# Patient Record
Sex: Female | Born: 1970 | Race: Black or African American | Hispanic: No | Marital: Single | Smoking: Never smoker
Health system: Southern US, Community
[De-identification: ages and names within clinical notes are randomized; demographics above are authoritative.]

## PROBLEM LIST (undated history)

## (undated) DIAGNOSIS — E119 Type 2 diabetes mellitus without complications: Secondary | ICD-10-CM

## (undated) DIAGNOSIS — I10 Essential (primary) hypertension: Secondary | ICD-10-CM

## (undated) DIAGNOSIS — J45909 Unspecified asthma, uncomplicated: Secondary | ICD-10-CM

## (undated) DIAGNOSIS — G4730 Sleep apnea, unspecified: Secondary | ICD-10-CM

## (undated) DIAGNOSIS — E785 Hyperlipidemia, unspecified: Secondary | ICD-10-CM

## (undated) DIAGNOSIS — F209 Schizophrenia, unspecified: Secondary | ICD-10-CM

## (undated) DIAGNOSIS — E039 Hypothyroidism, unspecified: Secondary | ICD-10-CM

## (undated) HISTORY — PX: OTHER SURGICAL HISTORY: SHX169

## (undated) HISTORY — DX: Essential (primary) hypertension: I10

## (undated) HISTORY — DX: Type 2 diabetes mellitus without complications: E11.9

---

## 2006-02-28 DIAGNOSIS — Z8742 Personal history of other diseases of the female genital tract: Secondary | ICD-10-CM | POA: Insufficient documentation

## 2006-04-01 ENCOUNTER — Emergency Department: Payer: Self-pay | Admitting: Emergency Medicine

## 2006-04-11 ENCOUNTER — Inpatient Hospital Stay: Payer: Self-pay | Admitting: Unknown Physician Specialty

## 2008-11-05 ENCOUNTER — Ambulatory Visit: Payer: Self-pay | Admitting: Internal Medicine

## 2009-01-29 ENCOUNTER — Ambulatory Visit: Payer: Self-pay | Admitting: Internal Medicine

## 2009-09-07 ENCOUNTER — Ambulatory Visit: Payer: Self-pay | Admitting: Family Medicine

## 2013-05-17 ENCOUNTER — Ambulatory Visit: Payer: Self-pay | Admitting: Family Medicine

## 2013-10-31 ENCOUNTER — Ambulatory Visit: Payer: Self-pay | Admitting: Family Medicine

## 2014-07-23 DIAGNOSIS — Z9149 Other personal history of psychological trauma, not elsewhere classified: Secondary | ICD-10-CM | POA: Insufficient documentation

## 2014-07-23 DIAGNOSIS — F209 Schizophrenia, unspecified: Secondary | ICD-10-CM | POA: Insufficient documentation

## 2014-07-23 DIAGNOSIS — E119 Type 2 diabetes mellitus without complications: Secondary | ICD-10-CM | POA: Insufficient documentation

## 2014-07-23 DIAGNOSIS — I10 Essential (primary) hypertension: Secondary | ICD-10-CM | POA: Insufficient documentation

## 2014-07-23 DIAGNOSIS — G4730 Sleep apnea, unspecified: Secondary | ICD-10-CM | POA: Insufficient documentation

## 2014-07-23 DIAGNOSIS — G3184 Mild cognitive impairment, so stated: Secondary | ICD-10-CM | POA: Insufficient documentation

## 2014-07-23 DIAGNOSIS — J45909 Unspecified asthma, uncomplicated: Secondary | ICD-10-CM | POA: Insufficient documentation

## 2015-01-27 ENCOUNTER — Ambulatory Visit: Payer: Self-pay | Admitting: Family Medicine

## 2015-09-16 LAB — HM PAP SMEAR: HM Pap smear: NEGATIVE

## 2016-03-17 DIAGNOSIS — E669 Obesity, unspecified: Secondary | ICD-10-CM | POA: Insufficient documentation

## 2017-02-28 ENCOUNTER — Other Ambulatory Visit: Payer: Self-pay | Admitting: Family Medicine

## 2017-03-01 ENCOUNTER — Other Ambulatory Visit: Payer: Self-pay | Admitting: Family Medicine

## 2017-03-01 DIAGNOSIS — Z1239 Encounter for other screening for malignant neoplasm of breast: Secondary | ICD-10-CM

## 2017-03-29 ENCOUNTER — Ambulatory Visit
Admission: RE | Admit: 2017-03-29 | Discharge: 2017-03-29 | Disposition: A | Discharge status: Home / Self Care | Payer: Medicaid Other | Source: Ambulatory Visit | Attending: Family Medicine | Admitting: Family Medicine

## 2017-03-29 ENCOUNTER — Encounter (HOSPITAL_COMMUNITY): Payer: Self-pay

## 2017-03-29 DIAGNOSIS — Z1231 Encounter for screening mammogram for malignant neoplasm of breast: Secondary | ICD-10-CM | POA: Diagnosis not present

## 2017-03-29 DIAGNOSIS — Z1239 Encounter for other screening for malignant neoplasm of breast: Secondary | ICD-10-CM

## 2018-10-08 ENCOUNTER — Other Ambulatory Visit: Payer: Self-pay | Admitting: Family Medicine

## 2018-10-08 DIAGNOSIS — Z1231 Encounter for screening mammogram for malignant neoplasm of breast: Secondary | ICD-10-CM

## 2018-10-12 LAB — HM HIV SCREENING LAB: HM HIV Screening: NEGATIVE

## 2019-12-10 ENCOUNTER — Other Ambulatory Visit: Payer: Self-pay | Admitting: Adult Health

## 2019-12-10 DIAGNOSIS — Z1231 Encounter for screening mammogram for malignant neoplasm of breast: Secondary | ICD-10-CM

## 2020-05-04 DIAGNOSIS — E66813 Obesity, class 3: Secondary | ICD-10-CM

## 2020-05-04 DIAGNOSIS — Z6841 Body Mass Index (BMI) 40.0 and over, adult: Secondary | ICD-10-CM

## 2020-05-04 DIAGNOSIS — Z9149 Other personal history of psychological trauma, not elsewhere classified: Secondary | ICD-10-CM

## 2020-05-04 DIAGNOSIS — G3184 Mild cognitive impairment, so stated: Secondary | ICD-10-CM

## 2020-05-04 DIAGNOSIS — Z8742 Personal history of other diseases of the female genital tract: Secondary | ICD-10-CM

## 2020-05-05 ENCOUNTER — Ambulatory Visit: Payer: Self-pay

## 2020-05-05 ENCOUNTER — Other Ambulatory Visit: Payer: Self-pay

## 2020-05-05 ENCOUNTER — Ambulatory Visit (LOCAL_COMMUNITY_HEALTH_CENTER): Payer: Medicaid Other | Admitting: Family Medicine

## 2020-05-05 ENCOUNTER — Encounter: Payer: Self-pay | Admitting: Family Medicine

## 2020-05-05 VITALS — BP 104/72 | HR 66 | Ht 66.75 in | Wt 254.0 lb

## 2020-05-05 DIAGNOSIS — E119 Type 2 diabetes mellitus without complications: Secondary | ICD-10-CM

## 2020-05-05 DIAGNOSIS — Z3009 Encounter for other general counseling and advice on contraception: Secondary | ICD-10-CM

## 2020-05-05 DIAGNOSIS — Z3046 Encounter for surveillance of implantable subdermal contraceptive: Secondary | ICD-10-CM

## 2020-05-05 DIAGNOSIS — Z30017 Encounter for initial prescription of implantable subdermal contraceptive: Secondary | ICD-10-CM | POA: Diagnosis not present

## 2020-05-05 DIAGNOSIS — B3731 Acute candidiasis of vulva and vagina: Secondary | ICD-10-CM

## 2020-05-05 DIAGNOSIS — Z113 Encounter for screening for infections with a predominantly sexual mode of transmission: Secondary | ICD-10-CM

## 2020-05-05 DIAGNOSIS — L292 Pruritus vulvae: Secondary | ICD-10-CM

## 2020-05-05 DIAGNOSIS — Z6841 Body Mass Index (BMI) 40.0 and over, adult: Secondary | ICD-10-CM

## 2020-05-05 DIAGNOSIS — Z01419 Encounter for gynecological examination (general) (routine) without abnormal findings: Secondary | ICD-10-CM

## 2020-05-05 DIAGNOSIS — I10 Essential (primary) hypertension: Secondary | ICD-10-CM

## 2020-05-05 DIAGNOSIS — B373 Candidiasis of vulva and vagina: Secondary | ICD-10-CM

## 2020-05-05 LAB — WET PREP FOR TRICH, YEAST, CLUE
Trichomonas Exam: NEGATIVE
Yeast Exam: NEGATIVE

## 2020-05-05 MED ORDER — CLOTRIMAZOLE 1 % VA CREA: 1.0000 | TOPICAL_CREAM | Freq: Every day | VAGINAL | 0 refills | Status: AC

## 2020-05-05 MED ORDER — ETONOGESTREL 68 MG ~~LOC~~ IMPL
68.0000 mg | DRUG_IMPLANT | Freq: Once | SUBCUTANEOUS | Status: AC
  Administered 2020-05-05: 68 mg via SUBCUTANEOUS

## 2020-05-05 MED ORDER — NYSTATIN 100000 UNIT/GM EX POWD: 1.0000 "application " | Freq: Three times a day (TID) | CUTANEOUS | 0 refills | Status: DC

## 2020-05-05 NOTE — Progress Notes (Signed)
Family Planning Visit- Initial Visit  Subjective:  Mackenzie Randall is a 49 y.o.  No obstetric history on file.   being seen today for an initial well woman visit and to discuss family planning options.  She is currently using Nexplanon for pregnancy prevention. Patient reports she does not want a pregnancy in the next year.  Patient has the following medical conditions has Obesity, unspecified; History of abnormal cervical Pap smear; History of psychological trauma; Sleep apnea; Mild cognitive impairment; Asthma; Hypertension; Diabetes mellitus, type II ([place]); and Schizophrenia ([place]) on their problem list.  Chief Complaint  Patient presents with  . Annual Exam    Patient reports vulvar itching and irritation present for year. No aggravating or alleviating but has not tried anything for itching. Denies known history of vulvar cancer. Needs pap today. Here for birth control method as well.   Patient denies changes in medical hx though overall patient is a poor historian. Speech is delayed/slow. Has binder for care giver that needs to be filled out.    Body mass index is 40.08 kg/m. - Patient is eligible for diabetes screening based on BMI and age >60?  yes HA1C ordered? No- has PCP following   Patient reports 1 of partners in last year. Desires STI screening?  Yes  Has patient been screened once for HCV in the past?  No  No results found for: HCVAB  Does the patient have current of drug use, have a partner with drug use, and/or has been incarcerated since last result? No  If yes-- Screen for HCV through [facility]   Does the patient meet criteria for HBV testing? No  Criteria:  -Household, sexual or needle sharing contact with HBV -History of drug use -HIV positive -Those with known Hep C   Health Maintenance Due  Topic Date Due  . HEMOGLOBIN A1C  Never done  . Hepatitis C Screening  Never done  . PNEUMOCOCCAL POLYSACCHARIDE VACCINE AGE 75-64 HIGH RISK  Never done  . FOOT  EXAM  Never done  . OPHTHALMOLOGY EXAM  Never done  . COVID-19 Vaccine (1) Never done  . TETANUS/TDAP  Never done    Review of Systems  Constitutional: Negative for chills and fever.  Eyes: Negative for blurred vision and double vision.  Respiratory: Negative for cough and shortness of breath.   Cardiovascular: Negative for chest pain and orthopnea.  Gastrointestinal: Negative for nausea and vomiting.  Genitourinary: Negative for dysuria, flank pain and frequency.  Musculoskeletal: Negative for myalgias.  Skin: Negative for rash.  Neurological: Negative for dizziness, tingling, weakness and headaches.  Endo/Heme/Allergies: Does not bruise/bleed easily.  Psychiatric/Behavioral: Negative for depression and suicidal ideas. The patient is not nervous/anxious.     The following portions of the patient's history were reviewed and updated as appropriate: allergies, current medications, past family history, past medical history, past social history, past surgical history and problem list. Problem list updated.   See flowsheet for other program required questions.  Objective:   Vitals:   05/05/20 1124  BP: 104/72  Pulse: 66  Weight: 254 lb (115.2 kg)  Height: 5' 6.75" (1.695 m)    Physical Exam Vitals and nursing note reviewed. Exam conducted with a chaperone present.  Constitutional:      Appearance: Normal appearance.  HENT:     Head: Normocephalic.  Pulmonary:     Effort: Pulmonary effort is normal.  Genitourinary:    Exam position: Lithotomy position.     Tanner stage (  genital): 5.     Labia:        Right: Injury present.        Left: Injury present.      Vagina: Normal.     Cervix: Normal.     Uterus: Normal.      Adnexa: Right adnexa normal.     Comments: Bilateral labia are devoid of public hair and have evidence of excoriation. + hypopigmentation and evidence of new skin growth. Labia are speckled in appearance with pink healing skin patches.  Musculoskeletal:         General: Normal range of motion.  Skin:    General: Skin is warm and dry.  Neurological:     Mental Status: She is alert. Mental status is at baseline.     Nexplanon Removal and Insertion  Patient identified, informed consent performed, consent signed.   Patient does understand that irregular bleeding is a very common side effect of this medication. She was advised to have backup contraception for one week after replacement of the implant. Patient deemed to meet WHO criteria for being reasonably certain she is not pregnant.  Appropriate time out taken. Nexplanon site identified. Area prepped in usual sterile fashon. 3 ml of 1% lidocaine with epinephrine was used to anesthetize the area at the distal end of the implant. A small stab incision was made right beside the implant on the distal portion. The Nexplanon rod was grasped using hemostats and removed without difficulty. There was minimal blood loss. There were no complications.   Confirmed correct location of insertion site. The insertion site was identified 8-10 cm (3-4 inches) from the medial epicondyle of the humerus and 3-5 cm (1.25-2 inches) posterior to (below) the sulcus (groove) between the biceps and triceps muscles of the patient's left arm (same location at removed Nexplanon). New Nexplanon removed from packaging, Device confirmed in needle, then inserted full length of needle and withdrawn per handbook instructions. Nexplanon was able to palpated in the patient's leftarm; patient palpated the insert herself.  There was minimal blood loss. Patient insertion site covered with guaze and a pressure bandage to reduce any bruising. The patient tolerated the procedure well and was given post procedure instructions.     Assessment and Plan:  Mackenzie Randall is a 49 y.o. female presenting to the [facility] Department for an initial well woman exam/family planning visit  Contraception counseling: Reviewed all forms of birth  control options in the tiered based approach. available including abstinence; over the counter/barrier methods; hormonal contraceptive medication including pill, patch, ring, injection,contraceptive implant, ECP; hormonal and nonhormonal IUDs; permanent sterilization options including vasectomy and the various tubal sterilization modalities. Risks, benefits, and typical effectiveness rates were reviewed.  Questions were answered.  Written information was also given to the patient to review.  Patient desires nexplanon, this was placed for patient. She will follow up in  23yr for surveillance.  She was told to call with any further questions, or with any concerns about this method of contraception.  Emphasized use of condoms 100% of the time for STI prevention.  1. Class 3 severe obesity with body mass index (BMI) of 40.0 to 44.9 in adult, unspecified obesity type, unspecified whether serious comorbidity present (HCC) PCP to follow  2. Essential hypertension PCP to Follow  3. Type 2 diabetes mellitus without complication, unspecified whether long term insulin use (HCC) PCP to follow  4. Well woman exam with routine gynecological exam Reviewed diet and exercise Discussed preventive health  measures Recommended PCP care  Discussed colon cancer screening through PCP starting at 50 Updated the provided care giver binder with plan of care Pap today done Referred for mammogram today - Chlamydia/Gonorrhea [facility] - HIV/HCV [facility] - Syphilis Serology, [facility] - IGP, Aptima HPV  5. Nexplanon removal Performed today  6. Screening examination for venereal disease Treat wet mount per standing-- have + discharge with itching - WET PREP FOR TRICH, YEAST, CLUE - Chlamydia/Gonorrhea [facility] - HIV/HCV [facility] - Syphilis Serology, [facility]  7. Labial abnormality/Vulvar  - Could be vulvar excoriation for vulvar candidiasis but given severe presentation will treat empirically  with vaginal cream from [facility] supply and rx for  Topical powder for external use was sent to pharmacy.  - Referral placed to local OB GYN group, possible vulvar bx needed to rule out other causes of severe pruritis.  - Recommended patient follow up with PCP as well for T2DM management  - Updated provided caregiver binder with plan of care   Return in about 1 year (around 05/05/2021) for Yearly wellness exam.  No future appointments.  Federico Flake, MD

## 2020-05-05 NOTE — Progress Notes (Signed)
Copy of pharmacist drug review form sent for scanning. Client lives at [facility] and group home employee is not with client to verify all medications. Client reports am fingerstick glucose today was 113. Treated for yeast with Clotrimazole 1% vaginal cream as per standing order per verbal order from Dr. Alvester Morin. Client able to verbalize to RN how to use cream. Jossie Ng, RN

## 2020-05-08 LAB — IGP, APTIMA HPV
HPV Aptima: NEGATIVE
PAP Smear Comment: 0

## 2020-05-12 ENCOUNTER — Telehealth: Payer: Self-pay | Admitting: Obstetrics & Gynecology

## 2020-05-12 ENCOUNTER — Encounter: Payer: Self-pay | Admitting: Family Medicine

## 2020-05-12 LAB — HM HEPATITIS C SCREENING LAB: HM Hepatitis Screen: NEGATIVE

## 2020-05-12 LAB — HM HIV SCREENING LAB: HM HIV Screening: NEGATIVE

## 2020-05-12 NOTE — Telephone Encounter (Signed)
[facility] referring for Vulvar candidiasis ,Vulvar pruritus

## 2020-06-02 ENCOUNTER — Encounter: Payer: Self-pay | Admitting: Obstetrics & Gynecology

## 2020-07-06 ENCOUNTER — Encounter: Payer: Medicaid Other | Admitting: Obstetrics and Gynecology

## 2020-07-27 ENCOUNTER — Encounter: Payer: Medicaid Other | Admitting: Obstetrics and Gynecology

## 2020-08-12 ENCOUNTER — Encounter: Payer: Self-pay | Admitting: Obstetrics and Gynecology

## 2020-08-12 ENCOUNTER — Other Ambulatory Visit: Payer: Self-pay

## 2020-08-12 ENCOUNTER — Ambulatory Visit (INDEPENDENT_AMBULATORY_CARE_PROVIDER_SITE_OTHER): Payer: Medicaid Other | Admitting: Obstetrics and Gynecology

## 2020-08-12 ENCOUNTER — Telehealth: Payer: Self-pay | Admitting: Obstetrics and Gynecology

## 2020-08-12 VITALS — BP 128/70 | HR 70 | Resp 18 | Wt 258.2 lb

## 2020-08-12 DIAGNOSIS — B373 Candidiasis of vulva and vagina: Secondary | ICD-10-CM | POA: Diagnosis not present

## 2020-08-12 DIAGNOSIS — L280 Lichen simplex chronicus: Secondary | ICD-10-CM

## 2020-08-12 DIAGNOSIS — N852 Hypertrophy of uterus: Secondary | ICD-10-CM | POA: Diagnosis not present

## 2020-08-12 DIAGNOSIS — B3731 Acute candidiasis of vulva and vagina: Secondary | ICD-10-CM

## 2020-08-12 MED ORDER — CLOBETASOL PROPIONATE 0.05 % EX OINT: TOPICAL_OINTMENT | CUTANEOUS | 5 refills | Status: DC

## 2020-08-12 MED ORDER — FLUCONAZOLE 150 MG PO TABS: 150.0000 mg | ORAL_TABLET | ORAL | 5 refills | Status: AC

## 2020-08-12 NOTE — Telephone Encounter (Signed)
Mackenzie Randall with [facility] calling to confirm Rx for Diflucan.  Confirming whether refills x5 are as needed or if patient is to continue taking.

## 2020-08-12 NOTE — Patient Instructions (Signed)
Vaginal Yeast Infection, Adult  Vaginal yeast infection is a condition that causes vaginal discharge as well as soreness, swelling, and redness (inflammation) of the vagina. This is a common condition. Some women get this infection frequently. What are the causes? This condition is caused by a change in the normal balance of the yeast (candida) and bacteria that live in the vagina. This change causes an overgrowth of yeast, which causes the inflammation. What increases the risk? The condition is more likely to develop in women who:  Take antibiotic medicines.  Have diabetes.  Take birth control pills.  Are pregnant.  Douche often.  Have a weak body defense system (immune system).  Have been taking steroid medicines for a long time.  Frequently wear tight clothing. What are the signs or symptoms? Symptoms of this condition include:  White, thick, creamy vaginal discharge.  Swelling, itching, redness, and irritation of the vagina. The lips of the vagina (vulva) may be affected as well.  Pain or a burning feeling while urinating.  Pain during sex. How is this diagnosed? This condition is diagnosed based on:  Your medical history.  A physical exam.  A pelvic exam. Your health care provider will examine a sample of your vaginal discharge under a microscope. Your health care provider may send this sample for testing to confirm the diagnosis. How is this treated? This condition is treated with medicine. Medicines may be over-the-counter or prescription. You may be told to use one or more of the following:  Medicine that is taken by mouth (orally).  Medicine that is applied as a cream (topically).  Medicine that is inserted directly into the vagina (suppository). Follow these instructions at home:  Lifestyle  Do not have sex until your health care provider approves. Tell your sex partner that you have a yeast infection. That person should go to his or her health care  provider and ask if they should also be treated.  Do not wear tight clothes, such as pantyhose or tight pants.  Wear breathable cotton underwear. General instructions  Take or apply over-the-counter and prescription medicines only as told by your health care provider.  Eat more yogurt. This may help to keep your yeast infection from returning.  Do not use tampons until your health care provider approves.  Try taking a sitz bath to help with discomfort. This is a warm water bath that is taken while you are sitting down. The water should only come up to your hips and should cover your buttocks. Do this 3-4 times per day or as told by your health care provider.  Do not douche.  If you have diabetes, keep your blood sugar levels under control.  Keep all follow-up visits as told by your health care provider. This is important. Contact a health care provider if:  You have a fever.  Your symptoms go away and then return.  Your symptoms do not get better with treatment.  Your symptoms get worse.  You have new symptoms.  You develop blisters in or around your vagina.  You have blood coming from your vagina and it is not your menstrual period.  You develop pain in your abdomen. Summary  Vaginal yeast infection is a condition that causes discharge as well as soreness, swelling, and redness (inflammation) of the vagina.  This condition is treated with medicine. Medicines may be over-the-counter or prescription.  Take or apply over-the-counter and prescription medicines only as told by your health care provider.  Do not douche.   Do not have sex or use tampons until your health care provider approves.  Contact a health care provider if your symptoms do not get better with treatment or your symptoms go away and then return. This information is not intended to replace advice given to you by your health care provider. Make sure you discuss any questions you have with your health care  provider. Document Revised: 06/14/2019 Document Reviewed: 04/02/2018 Elsevier Patient Education  2020 Elsevier Inc.  

## 2020-08-12 NOTE — Progress Notes (Signed)
Patient ID: Mackenzie Randall, female   DOB: 08-19-1971, 49 y.o.   MRN: [id number]  Reason for Consult: Gynecologic Exam   Referred by Federico Flake,*  Subjective:     HPI:  Mackenzie Randall is a 49 y.o. female. She presents today without a care giver. Information and history is limited. She has seen Dr. Alvester Morin for vulvar candidiasis in the past which was reported in June of 2021 to be ongoing for a year. She is a resident in a group home. She does have diabetes. I do not know what type of treatment she has had for this issue. She reports she does have vaginal itching and she consented to an examination.   She denies heavy vaginal bleeding. She has a Nexplanon in place, replaced June 2021.  Pap smear in 2021 was NIL    Past Medical History:  Diagnosis Date  . Diabetes mellitus without complication (HCC)   . Hypertension    Family History  Problem Relation Age of Onset  . Diabetes Mother   . Hypertension Mother   . Sleep apnea Father   . Heart attack Father   . Gout Maternal Grandfather    Past Surgical History:  Procedure Laterality Date  . Denies surgical history      Short Social History:  Social History   Tobacco Use  . Smoking status: Former Games developer  . Smokeless tobacco: Never Used  . Tobacco comment: Reports remote history of cigarette use. Unsure what year quit as has been so long ago.  Substance Use Topics  . Alcohol use: Not Currently    No Known Allergies  Current Outpatient Medications  Medication Sig Dispense Refill  . amLODipine (NORVASC) 10 MG tablet Take 10 mg by mouth daily.    . carvedilol (COREG) 25 MG tablet Take 25 mg by mouth daily.    . cloNIDine (CATAPRES) 0.1 MG tablet Take 0.1 mg by mouth daily.    . furosemide (LASIX) 20 MG tablet Take 20 mg by mouth daily.    Marland Kitchen glipiZIDE (GLUCOTROL XL) 10 MG 24 hr tablet Take 10 mg by mouth daily.    . hydrochlorothiazide (HYDRODIURIL) 25 MG tablet Take 25 mg by mouth daily.    . insulin glargine  (LANTUS) 100 UNIT/ML injection Inject 50 Units into the skin at bedtime.    Marland Kitchen levothyroxine (SYNTHROID) 75 MCG tablet Take 75 mcg by mouth daily.    Marland Kitchen lisinopril (ZESTRIL) 40 MG tablet Take 40 mg by mouth daily.    . metFORMIN (GLUCOPHAGE) 1000 MG tablet Take 1,000 mg by mouth in the morning and at bedtime.    Marland Kitchen nystatin (MYCOSTATIN/NYSTOP) powder Apply 1 application topically 3 (three) times daily. 15 g 0  . pravastatin (PRAVACHOL) 40 MG tablet Take 40 mg by mouth daily.    . QUEtiapine (SEROQUEL XR) 400 MG 24 hr tablet Take 400 mg by mouth daily.    . sitaGLIPtin (JANUVIA) 100 MG tablet Take 100 mg by mouth daily.     No current facility-administered medications for this visit.    Review of Systems  Constitutional: Negative for chills, fatigue, fever and unexpected weight change.  HENT: Negative for trouble swallowing.  Eyes: Negative for loss of vision.  Respiratory: Negative for cough, shortness of breath and wheezing.  Cardiovascular: Negative for chest pain, leg swelling, palpitations and syncope.  GI: Negative for abdominal pain, blood in stool, diarrhea, nausea and vomiting.  GU: Negative for difficulty urinating, dysuria, frequency and hematuria.  Musculoskeletal:  Negative for back pain, leg pain and joint pain.  Skin: Negative for rash.  Neurological: Negative for dizziness, headaches, light-headedness, numbness and seizures.  Psychiatric: Negative for behavioral problem, confusion, depressed mood and sleep disturbance.        Objective:  Objective   Vitals:   08/12/20 1052  BP: 128/70  Pulse: 70  Resp: 18  SpO2: 98%  Weight: 258 lb 3.2 oz (117.1 kg)   Body mass index is 40.74 kg/m.  Physical Exam Vitals and nursing note reviewed.  Constitutional:      Appearance: She is well-developed.  HENT:     Head: Normocephalic and atraumatic.  Eyes:     Pupils: Pupils are equal, round, and reactive to light.  Cardiovascular:     Rate and Rhythm: Normal rate and  regular rhythm.  Pulmonary:     Effort: Pulmonary effort is normal. No respiratory distress.  Skin:    General: Skin is warm and dry.  Neurological:     Mental Status: She is alert and oriented to person, place, and time.  Psychiatric:        Behavior: Behavior normal.        Thought Content: Thought content normal.        Judgment: Judgment normal.        Assessment/Plan:    49 yo with vulvar puritis- appearance consistent with lichen simplex chronicus.  Start daily clobetasol ointment applied 1-2 times a day to the affected area for the next 4 weeks. Will need to continue treatment likely for several months until resolution.   Possible recurrent yeast vaginitis, curd like discharge in groin- will check Nuswab. Will start Oral diflucan weekly for yeast suppression. Yeast can sometimes be an inciting event for  lichen chronicus.   Bulky uterus, return for a pelvic US. Likely fibroid. Patient does not report heavy bleeding at this time. Nexplanon in place.   More than 25 minutes were spent face to face with the patient in the room, reviewing the medical record, labs and images, and coordinating care for the patient. The plan of management was discussed in detail and counseling was provided.      Adelene Idler MD [facility], [facility] 08/12/2020 11:38 AM

## 2020-08-15 LAB — NUSWAB BV AND CANDIDA, NAA
Candida albicans, NAA: NEGATIVE
Candida glabrata, NAA: NEGATIVE

## 2020-09-08 ENCOUNTER — Encounter: Payer: Self-pay | Admitting: Obstetrics and Gynecology

## 2020-09-08 ENCOUNTER — Ambulatory Visit (INDEPENDENT_AMBULATORY_CARE_PROVIDER_SITE_OTHER): Payer: Medicaid Other

## 2020-09-08 ENCOUNTER — Ambulatory Visit (INDEPENDENT_AMBULATORY_CARE_PROVIDER_SITE_OTHER): Payer: Medicaid Other | Admitting: Obstetrics and Gynecology

## 2020-09-08 ENCOUNTER — Other Ambulatory Visit: Payer: Self-pay

## 2020-09-08 VITALS — BP 118/72 | HR 89 | Resp 18 | Wt 256.0 lb

## 2020-09-08 DIAGNOSIS — Z0000 Encounter for general adult medical examination without abnormal findings: Secondary | ICD-10-CM | POA: Diagnosis not present

## 2020-09-08 DIAGNOSIS — N852 Hypertrophy of uterus: Secondary | ICD-10-CM

## 2020-09-08 DIAGNOSIS — L280 Lichen simplex chronicus: Secondary | ICD-10-CM

## 2020-09-08 DIAGNOSIS — Z23 Encounter for immunization: Secondary | ICD-10-CM | POA: Diagnosis not present

## 2020-09-08 MED ORDER — CLOBETASOL PROPIONATE 0.05 % EX OINT: TOPICAL_OINTMENT | CUTANEOUS | 5 refills | Status: AC

## 2020-09-08 NOTE — Progress Notes (Signed)
Patient ID: Mackenzie Randall, female   DOB: [birth year], 49 y.o.   MRN: [id number]  Reason for Consult: Gynecologic Exam   Referred by [facility]*  Subjective:     HPI:  Mackenzie Randall is a 49 y.o. female . She reports that she is feeling much better. Her urge to itch has been greatly reduced. She is applying clobetasol twice a day.   Past Medical History:  Diagnosis Date  . Diabetes mellitus without complication (HCC)   . Hypertension    Family History  Problem Relation Age of Onset  . Diabetes Mother   . Hypertension Mother   . Sleep apnea Father   . Heart attack Father   . Gout Maternal Grandfather    Past Surgical History:  Procedure Laterality Date  . Denies surgical history      Short Social History:  Social History   Tobacco Use  . Smoking status: Former Games developer  . Smokeless tobacco: Never Used  . Tobacco comment: Reports remote history of cigarette use. Unsure what year quit as has been so long ago.  Substance Use Topics  . Alcohol use: Not Currently    No Known Allergies  Current Outpatient Medications  Medication Sig Dispense Refill  . amLODipine (NORVASC) 10 MG tablet Take 10 mg by mouth daily.    . carvedilol (COREG) 25 MG tablet Take 25 mg by mouth daily.    . clobetasol ointment (TEMOVATE) 0.05 % Apply once a day for 4 weeks, then every other day for 4 weeks, then once a week as needed. 30 g 5  . cloNIDine (CATAPRES) 0.1 MG tablet Take 0.1 mg by mouth daily.    . furosemide (LASIX) 20 MG tablet Take 20 mg by mouth daily.    Marland Kitchen glipiZIDE (GLUCOTROL XL) 10 MG 24 hr tablet Take 10 mg by mouth daily.    . hydrochlorothiazide (HYDRODIURIL) 25 MG tablet Take 25 mg by mouth daily.    . insulin glargine (LANTUS) 100 UNIT/ML injection Inject 50 Units into the skin at bedtime.    Marland Kitchen levothyroxine (SYNTHROID) 75 MCG tablet Take 75 mcg by mouth daily.    Marland Kitchen lisinopril (ZESTRIL) 40 MG tablet Take 40 mg by mouth daily.    . metFORMIN (GLUCOPHAGE) 1000 MG  tablet Take 1,000 mg by mouth in the morning and at bedtime.    Marland Kitchen nystatin (MYCOSTATIN/NYSTOP) powder Apply 1 application topically 3 (three) times daily. 15 g 0  . pravastatin (PRAVACHOL) 40 MG tablet Take 40 mg by mouth daily.    . QUEtiapine (SEROQUEL XR) 400 MG 24 hr tablet Take 400 mg by mouth daily.    . sitaGLIPtin (JANUVIA) 100 MG tablet Take 100 mg by mouth daily.     No current facility-administered medications for this visit.    Review of Systems  Constitutional: Negative for chills, fatigue, fever and unexpected weight change.  HENT: Negative for trouble swallowing.  Eyes: Negative for loss of vision.  Respiratory: Negative for cough, shortness of breath and wheezing.  Cardiovascular: Negative for chest pain, leg swelling, palpitations and syncope.  GI: Negative for abdominal pain, blood in stool, diarrhea, nausea and vomiting.  GU: Negative for difficulty urinating, dysuria, frequency and hematuria.  Musculoskeletal: Negative for back pain, leg pain and joint pain.  Skin: Negative for rash.  Neurological: Negative for dizziness, headaches, light-headedness, numbness and seizures.  Psychiatric: Negative for behavioral problem, confusion, depressed mood and sleep disturbance.        Objective:  Objective   Vitals:   09/08/20 1102  BP: 118/72  Pulse: 89  Resp: 18  SpO2: 98%  Weight: 256 lb (116.1 kg)   Body mass index is 40.4 kg/m.  Physical Exam Exam conducted with a chaperone present.  Genitourinary:      Comments: Significant improvement from last exam. Area is reduced in redness and swelling. Skin is almost completely healed. Small pink skin still present on right labia, but largely healed.         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 Patient reports she is not itching as much and that see feels significantly better. Will continue clobetasol ointment. Decrease to once a day for 4 weeks. Then apply every other  night for 4 weeks. Then use once a week as needed.  Call if any worsening of symptoms.  Was on 4 weeks of diflucan for topical yeast infection, could consider  Extended treatment for 6 months if needed.  Normal pelvic US today.   More than 20 minutes were spent face to face with the patient in the room, reviewing the medical record, labs and images, and coordinating care for the patient. The plan of management was discussed in detail and counseling was provided.    Adelene Idler MD, [facility], [facility] 09/08/2020 12:00 PM

## 2020-09-08 NOTE — Progress Notes (Signed)
Pt here for GYN and U/S.

## 2020-11-12 ENCOUNTER — Other Ambulatory Visit: Payer: Self-pay

## 2020-11-12 ENCOUNTER — Ambulatory Visit
Admission: RE | Admit: 2020-11-12 | Discharge: 2020-11-12 | Disposition: A | Discharge status: Home / Self Care | Payer: Medicaid Other | Source: Ambulatory Visit | Attending: Family Medicine | Admitting: Family Medicine

## 2020-11-12 DIAGNOSIS — Z1231 Encounter for screening mammogram for malignant neoplasm of breast: Secondary | ICD-10-CM | POA: Diagnosis not present

## 2020-11-12 DIAGNOSIS — Z01419 Encounter for gynecological examination (general) (routine) without abnormal findings: Secondary | ICD-10-CM

## 2020-11-25 ENCOUNTER — Telehealth: Payer: Self-pay

## 2020-11-25 NOTE — Telephone Encounter (Signed)
Return call by patient.  Reviewed Mammogram results with patient.  Low risk Mammogram.  Repeat 1-2 years per Lyndel Safe, MD. Hart Carwin, RN

## 2020-11-25 NOTE — Telephone Encounter (Signed)
Telephone call to patient today.  Left a message to return my call. Hart Carwin, RN    RE: Mammogram is low risk.  Repeat 1-2 years per Lyndel Safe, MD. Hart Carwin, RN

## 2020-12-09 ENCOUNTER — Other Ambulatory Visit: Payer: Self-pay

## 2020-12-09 ENCOUNTER — Ambulatory Visit (INDEPENDENT_AMBULATORY_CARE_PROVIDER_SITE_OTHER): Payer: Medicaid Other | Admitting: Obstetrics and Gynecology

## 2020-12-09 ENCOUNTER — Encounter: Payer: Self-pay | Admitting: Obstetrics and Gynecology

## 2020-12-09 NOTE — Progress Notes (Signed)
PHONE VISIT Pt lives in a group home i'm not sure she knew why I was calling. Pt hung the phone up.

## 2021-03-24 ENCOUNTER — Other Ambulatory Visit: Payer: Self-pay | Admitting: Adult Health

## 2021-03-24 DIAGNOSIS — Z1231 Encounter for screening mammogram for malignant neoplasm of breast: Secondary | ICD-10-CM

## 2021-03-24 NOTE — Progress Notes (Signed)
Appointment cancelled, not able to reach patient.

## 2021-12-20 ENCOUNTER — Other Ambulatory Visit: Payer: Self-pay | Admitting: Family Medicine

## 2022-01-03 ENCOUNTER — Other Ambulatory Visit: Payer: Self-pay

## 2022-01-03 ENCOUNTER — Ambulatory Visit
Admission: RE | Admit: 2022-01-03 | Discharge: 2022-01-03 | Disposition: A | Discharge status: Home / Self Care | Payer: Medicaid Other | Source: Ambulatory Visit | Attending: Adult Health | Admitting: Adult Health

## 2022-01-03 DIAGNOSIS — Z1231 Encounter for screening mammogram for malignant neoplasm of breast: Secondary | ICD-10-CM | POA: Diagnosis present

## 2022-03-05 IMAGING — MG MM DIGITAL SCREENING BILAT W/ TOMO AND CAD
8 series · 8 of 24 positions shown · non-contrast
Comparison: Previous exam(s).

ACR Breast Density Category a: The breast tissue is almost entirely
fatty.

CLINICAL DATA: Screening.

EXAM:
DIGITAL SCREENING BILATERAL MAMMOGRAM WITH TOMOSYNTHESIS AND CAD
TECHNIQUE: Bilateral screening digital craniocaudal and mediolateral oblique
mammograms were obtained. Bilateral screening digital breast
tomosynthesis was performed. The images were evaluated with
computer-aided detection.

[R MLO synth-2D]
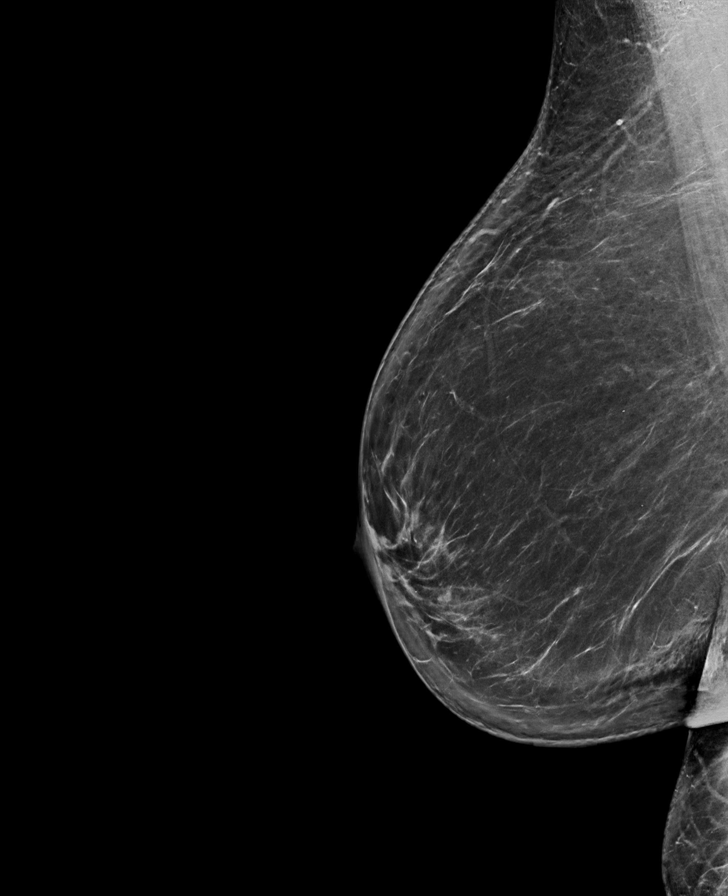

[L MLO synth-2D]
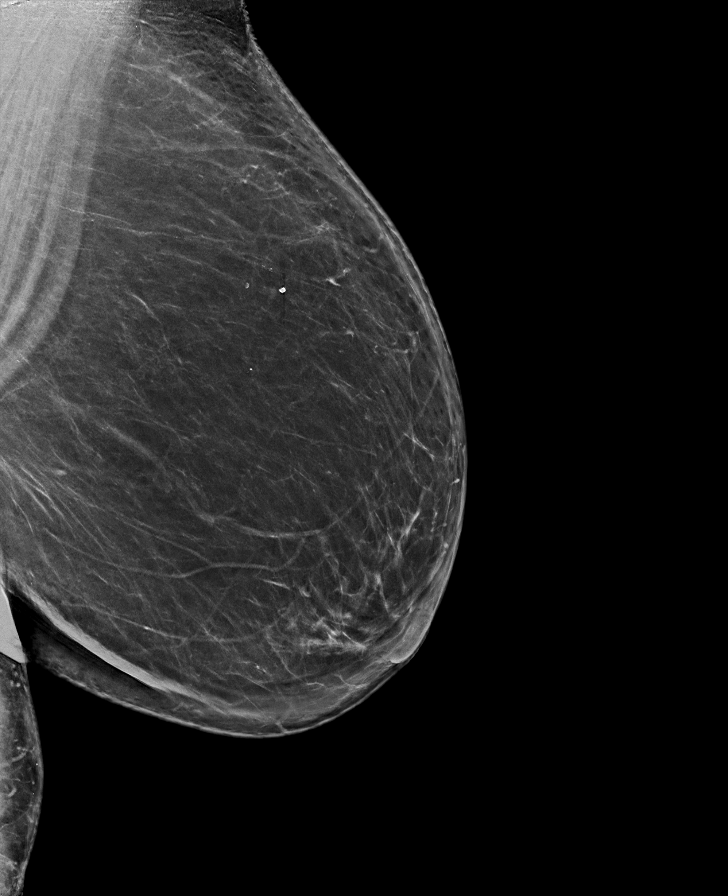

[R CC synth-2D]
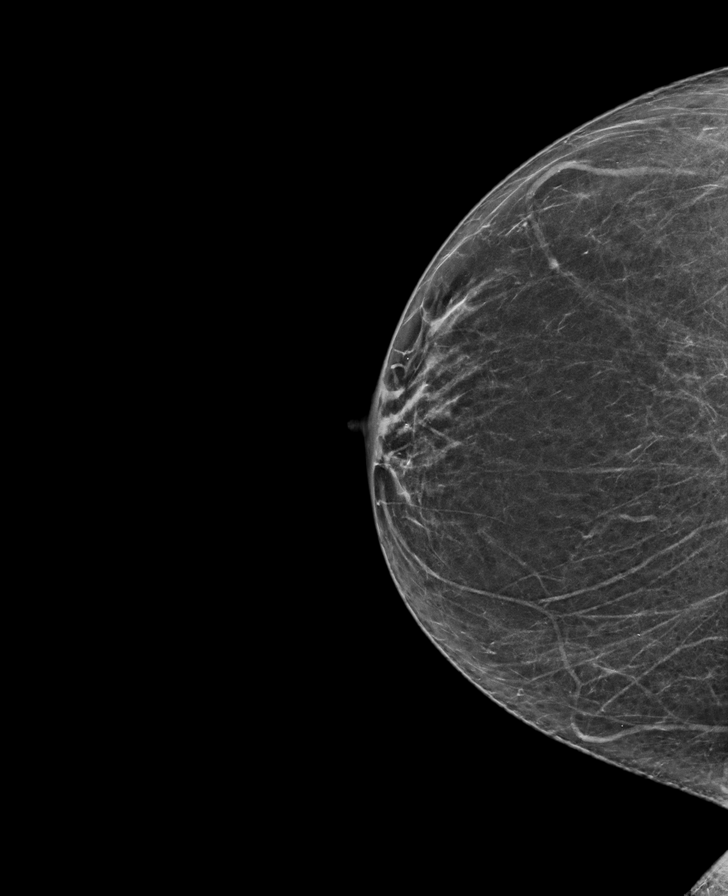

[L CC synth-2D]
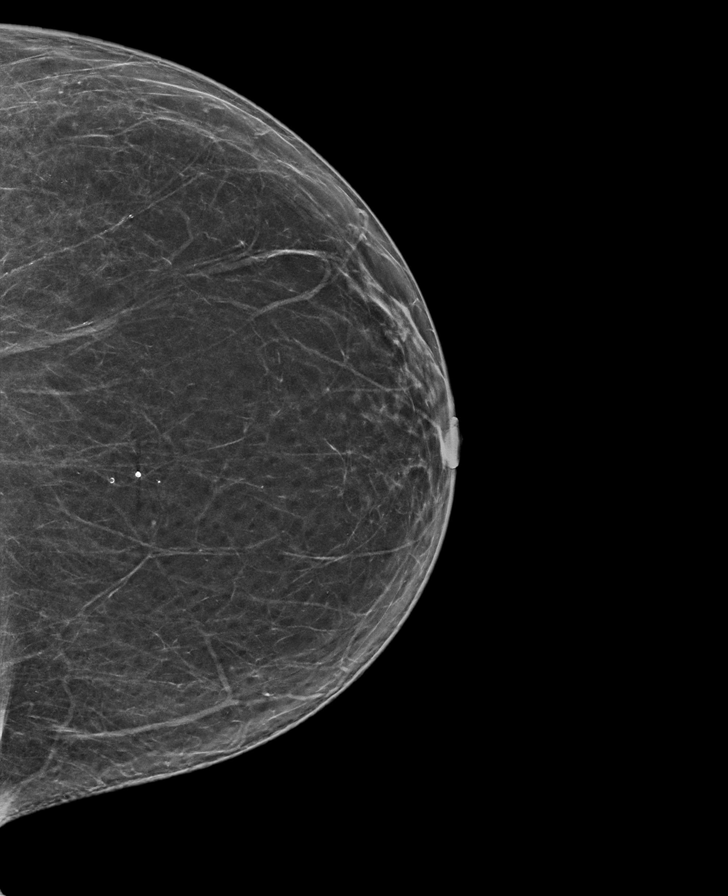

[R CC tomo · tomo slice 37/74.0]
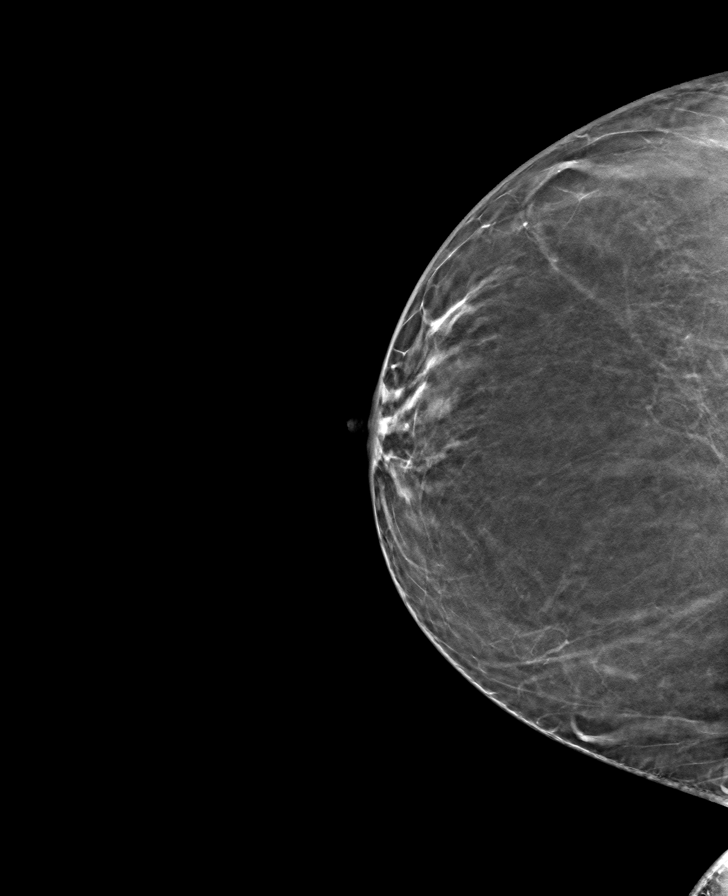

[L CC tomo · tomo slice 37/74.0]
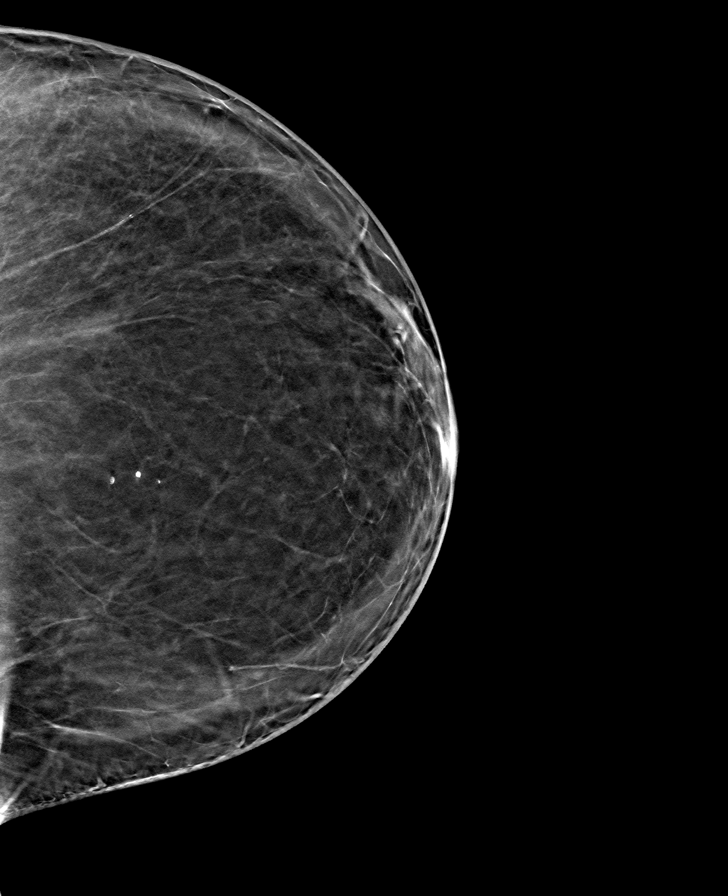

[L MLO tomo · tomo slice 48/95.0]
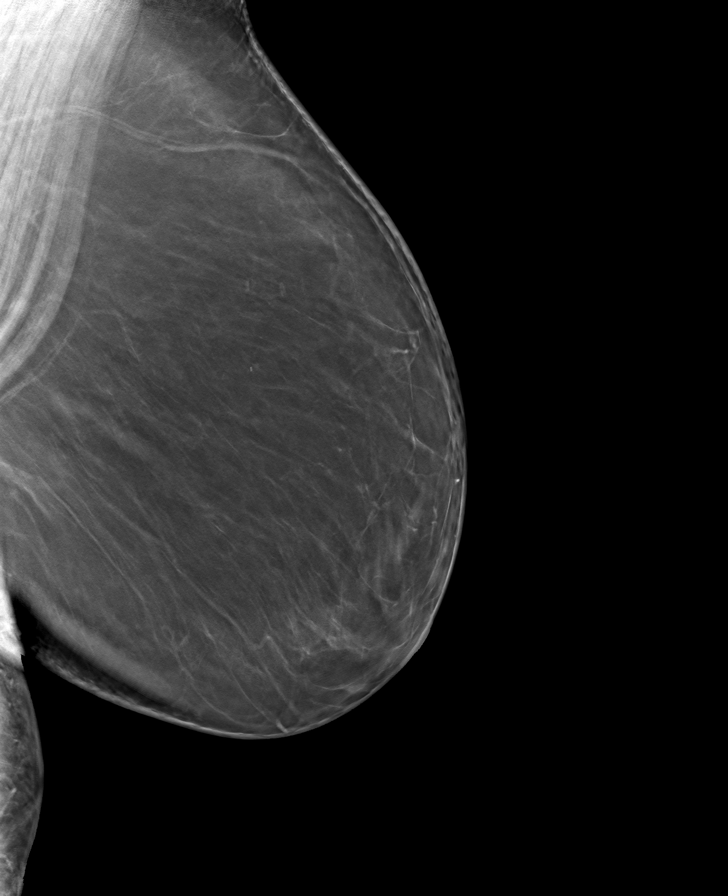

[R MLO tomo · tomo slice 45/90.0]
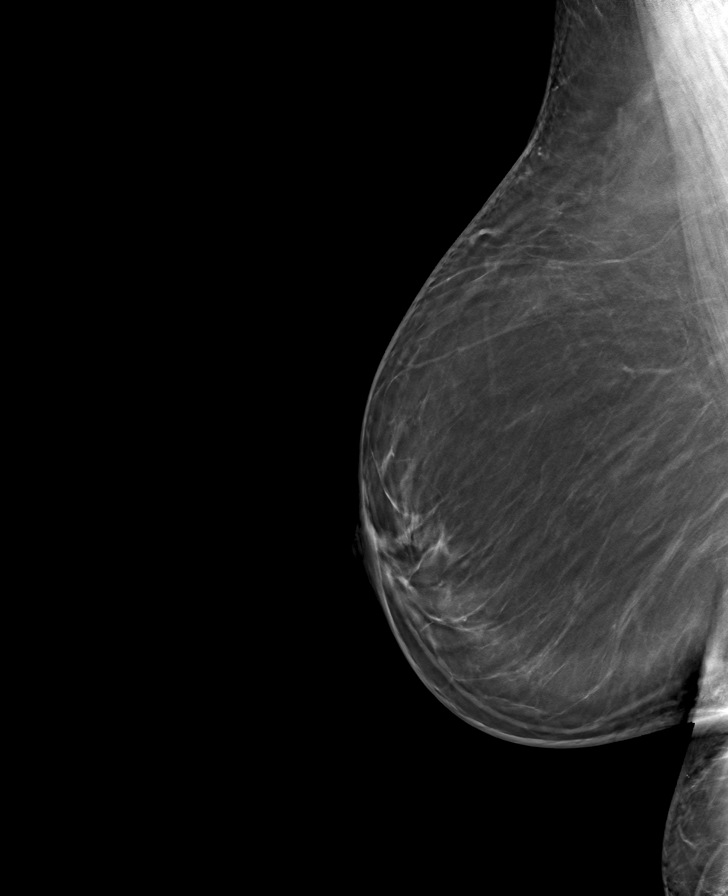

[8 of 24 positions shown; findings below may reference images not displayed]

FINDINGS: There are no findings suspicious for malignancy.
IMPRESSION: No mammographic evidence of malignancy. A result letter of this
screening mammogram will be mailed directly to the patient.

RECOMMENDATION:
Screening mammogram in one year. (Code:0E-3-N98)

BI-RADS CATEGORY  1: Negative.

## 2022-04-21 ENCOUNTER — Other Ambulatory Visit: Payer: Self-pay

## 2023-02-01 ENCOUNTER — Ambulatory Visit (LOCAL_COMMUNITY_HEALTH_CENTER): Payer: Medicaid Other | Admitting: Family Medicine

## 2023-02-01 ENCOUNTER — Encounter: Payer: Medicaid Other | Admitting: Family Medicine

## 2023-02-01 ENCOUNTER — Encounter: Payer: Self-pay | Admitting: Family Medicine

## 2023-02-01 ENCOUNTER — Ambulatory Visit: Payer: Medicaid Other

## 2023-02-01 VITALS — BP 129/74 | HR 72 | Temp 96.8°F | Wt 224.6 lb

## 2023-02-01 DIAGNOSIS — Z23 Encounter for immunization: Secondary | ICD-10-CM | POA: Diagnosis not present

## 2023-02-01 DIAGNOSIS — Z975 Presence of (intrauterine) contraceptive device: Secondary | ICD-10-CM

## 2023-02-01 DIAGNOSIS — Z3009 Encounter for other general counseling and advice on contraception: Secondary | ICD-10-CM

## 2023-02-01 DIAGNOSIS — Z113 Encounter for screening for infections with a predominantly sexual mode of transmission: Secondary | ICD-10-CM

## 2023-02-01 DIAGNOSIS — Z01419 Encounter for gynecological examination (general) (routine) without abnormal findings: Secondary | ICD-10-CM

## 2023-02-01 DIAGNOSIS — Z8742 Personal history of other diseases of the female genital tract: Secondary | ICD-10-CM

## 2023-02-01 LAB — WET PREP FOR TRICH, YEAST, CLUE
Trichomonas Exam: NEGATIVE
Yeast Exam: NEGATIVE

## 2023-02-01 LAB — HM HIV SCREENING LAB: HM HIV Screening: NEGATIVE

## 2023-02-01 NOTE — Progress Notes (Addendum)
Pt here for [facility], PE and STI routine screen. Immunization visit made for pt & pt given flu shot this visit, tolerated well, VIS along with copy of immunization record given. Pt needs paperwork to be read to as pt states that she cannot read.  [facility] referral faxed with confirmation received.  Wet prep reviewed; no treatment indicated.   Harland Dingwall, RN

## 2023-02-01 NOTE — Progress Notes (Signed)
Please refer to 10:30am appt

## 2023-02-01 NOTE — Progress Notes (Signed)
[facility] Number: [phone]  Family Planning Visit- Repeat Yearly Visit  Subjective:  Mackenzie Randall is a 52 y.o. G0P0000  being seen today for an annual wellness visit and to discuss contraception options.   The patient is currently using Hormonal Implant for pregnancy prevention. Patient does not want a pregnancy in the next year.    report they are looking for a method that provides High efficacy at preventing pregnancy   Patient has the following medical problems: has Obesity, unspecified; History of abnormal cervical Pap smear; History of psychological trauma; Sleep apnea; Mild cognitive impairment; Asthma; Hypertension; Diabetes mellitus, type II ([place]); Schizophrenia ([place]); and Nexplanon in place on their problem list.  Chief Complaint  Patient presents with   Contraception    [employer], pt here for a PE and to get Nexplanon removed.    Patient reports to clinic for PE  See flowsheet for other program required questions.   Body mass index is 35.44 kg/m. - Patient is eligible for diabetes screening based on BMI> 25 and age >35?  yes HA1C ordered? No- pt has known DM- takes medication, has PCP  Patient reports 1 of partners in last year. Desires STI screening?  Yes   Has patient been screened once for HCV in the past?  No  No results found for: "HCVAB"  Does the patient have current of drug use, have a partner with drug use, and/or has been incarcerated since last result? No  If yes-- Screen for HCV through [facility]   Does the patient meet criteria for HBV testing? No  Criteria:  -Household, sexual or needle sharing contact with HBV -History of drug use -HIV positive -Those with known Hep C   Health Maintenance Due  Topic Date Due   HEMOGLOBIN A1C  Never done   COVID-19 Vaccine (1) Never done   FOOT EXAM  Never done   OPHTHALMOLOGY EXAM  Never done   Diabetic  kidney evaluation - eGFR measurement  Never done   Diabetic kidney evaluation - Urine ACR  Never done   COLONOSCOPY (Pts 45-36yr Insurance coverage will need to be confirmed)  Never done   DTaP/Tdap/Td (2 - Td or Tdap) 06/18/2017   Zoster Vaccines- Shingrix (1 of 2) Never done   INFLUENZA VACCINE  06/28/2022    Review of Systems  Constitutional:  Negative for weight loss.  Eyes:  Negative for blurred vision.  Respiratory:  Negative for cough and shortness of breath.   Cardiovascular:  Negative for claudication.  Gastrointestinal:  Negative for nausea.  Genitourinary:  Negative for dysuria and frequency.  Skin:  Negative for rash.  Neurological:  Negative for headaches.  Endo/Heme/Allergies:  Does not bruise/bleed easily.    The following portions of the patient's history were reviewed and updated as appropriate: allergies, current medications, past family history, past medical history, past social history, past surgical history and problem list. Problem list updated.  Objective:   Vitals:   02/01/23 1004  BP: 129/74  Pulse: 72  Temp: (!) 96.8 F (36 C)  Weight: 224 lb 9.6 oz (101.9 kg)    Physical Exam Vitals and nursing note reviewed. Exam conducted with a chaperone present.  Constitutional:      Appearance: She is obese.  HENT:     Head: Normocephalic and atraumatic.     Mouth/Throat:     Mouth: Mucous membranes are moist.     Pharynx:  Oropharynx is clear. No oropharyngeal exudate or posterior oropharyngeal erythema.  Pulmonary:     Effort: Pulmonary effort is normal.  Chest:     Comments: Declined breast exam today Abdominal:     Palpations: Abdomen is soft. There is no mass.     Tenderness: There is no abdominal tenderness. There is no rebound.  Genitourinary:    General: Normal vulva.     Exam position: Lithotomy position.     Pubic Area: No rash or pubic lice.      Labia:        Right: No rash or lesion.        Left: No rash or lesion.      Vagina:  Normal. No vaginal discharge, erythema, bleeding or lesions.     Cervix: No cervical motion tenderness, discharge, friability, lesion or erythema.     Uterus: Normal.      Adnexa: Right adnexa normal and left adnexa normal.     Rectum: Normal.     Comments: pH = 5 Lymphadenopathy:     Head:     Right side of head: No preauricular or posterior auricular adenopathy.     Left side of head: No preauricular or posterior auricular adenopathy.     Cervical: No cervical adenopathy.     Upper Body:     Right upper body: No supraclavicular, axillary or epitrochlear adenopathy.     Left upper body: No supraclavicular, axillary or epitrochlear adenopathy.     Lower Body: No right inguinal adenopathy. No left inguinal adenopathy.  Skin:    General: Skin is warm and dry.     Findings: No rash.  Neurological:     Mental Status: She is alert. Mental status is at baseline.  Psychiatric:        Mood and Affect: Mood normal.     Comments: Cognitive impairment     Assessment and Plan:  Mackenzie Randall is a 52 y.o. female [place] presenting to the [facility] Department for an yearly wellness and contraception visit   Contraception counseling: Reviewed options based on patient desire and reproductive life plan. Patient is interested in Hormonal Implant. This was not provided to the patient today. Implant already in place  Risks, benefits, and typical effectiveness rates were reviewed.  Questions were answered.  Written information was also given to the patient to review.    The patient will follow up in  1 years for surveillance.  The patient was told to call with any further questions, or with any concerns about this method of contraception.  Emphasized use of condoms 100% of the time for STI prevention.  Patient was assessed for need for ECP. Not indicated- implant in place   1. Well woman exam with routine gynecological exam -pt c/o heat/cold intolerance- appears like pt is having "hot  flashes"/ may have started menopause -pt also c/o weight changes -no other concerns today- lives in group home, has not been sexually active in some time -had mammogram in 11/2022, declined CBE today -requested referral back to [facility] so she could get medical management for menopause - faxed today by RN  2. Nexplanon in place -placed 05/05/2020, good until 04/2024 -I would not recommend another Nexplanon, as this patient is likely going through menopause- states she "gets real hot" and appeared to be having a hot flash during appointment  3. Screening for venereal disease  - WET PREP FOR [place], YEAST, CLUE - Chlamydia/Gonorrhea [facility] - HIV  [facility] - Syphilis Serology, [facility]  4. History of abnormal cervical Pap smear -last two pap tests has been normal and HPV negative -not due for Pap testing until 04/2024   Return if symptoms worsen or fail to improve.  Future Appointments  Date Time Provider [place]  02/01/2023 11:00 AM Sharlet Salina, [place] [facility] None    Sharlet Salina, [place]

## 2023-03-14 NOTE — Progress Notes (Signed)
[employer]   No chief complaint on file.   HPI:      [name] is a 52 y.o. G0P0000 whose LMP was No LMP recorded. Patient has had an implant., presents today for ***    Patient Active Problem List   Diagnosis Date Noted  . Nexplanon in place 02/01/2023  . Obesity, unspecified 03/17/2016  . History of psychological trauma 07/23/2014  . Sleep apnea 07/23/2014  . Mild cognitive impairment 07/23/2014  . Asthma 07/23/2014  . Hypertension 07/23/2014  . Diabetes mellitus, type II 07/23/2014  . Schizophrenia 07/23/2014  . History of abnormal cervical Pap smear 02/28/2006    Past Surgical History:  Procedure Laterality Date  . Denies surgical history      Family History  Problem Relation Age of Onset  . Diabetes Mother   . Hypertension Mother   . Sleep apnea Father   . Heart attack Father   . Gout Maternal Grandfather     Social History   Socioeconomic History  . Marital status: Single    Spouse name: Not on file  . Number of children: 0  . Years of education: Not on file  . Highest education level: High school graduate  Occupational History  . Not on file  Tobacco Use  . Smoking status: Former  . Smokeless tobacco: Never  . Tobacco comments:    Reports remote history of cigarette use. Unsure what year quit as has been so long ago.  Vaping Use  . Vaping Use: Never used  Substance and Sexual Activity  . Alcohol use: Not Currently  . Drug use: Never  . Sexual activity: Not Currently    Birth control/protection: Implant  Other Topics Concern  . Not on file  Social History Narrative  . Not on file   Social Determinants of Health   Financial Resource Strain: Not on file  Food Insecurity: Not on file  Transportation Needs: Not on file  Physical Activity: Not on file  Stress: Not on file  Social Connections: Not on file  Intimate Partner Violence: Not At Risk (05/05/2020)   Humiliation, Afraid, Rape, and Kick questionnaire    . Fear of Current or Ex-Partner: No   . Emotionally Abused: No   . Physically Abused: No   . Sexually Abused: No    Outpatient Medications Prior to Visit  Medication Sig Dispense Refill  . amLODipine (NORVASC) 10 MG tablet Take 10 mg by mouth daily.    . carvedilol (COREG) 25 MG tablet Take 25 mg by mouth daily.    . clobetasol ointment (TEMOVATE) 0.05 % Apply once a day for 4 weeks, then every other day for 4 weeks, then once a week as needed. 30 g 5  . cloNIDine (CATAPRES) 0.1 MG tablet Take 0.1 mg by mouth daily.    . furosemide (LASIX) 20 MG tablet Take 20 mg by mouth daily.    Marland Kitchen glipiZIDE (GLUCOTROL XL) 10 MG 24 hr tablet Take 10 mg by mouth daily.    . hydrochlorothiazide (HYDRODIURIL) 25 MG tablet Take 25 mg by mouth daily.    . insulin glargine (LANTUS) 100 UNIT/ML injection Inject 50 Units into the skin at bedtime.    Marland Kitchen levothyroxine (SYNTHROID) 75 MCG tablet Take 75 mcg by mouth daily.    Marland Kitchen lisinopril (ZESTRIL) 40 MG tablet Take 40 mg by mouth daily.    . metFORMIN (GLUCOPHAGE) 1000 MG tablet Take 1,000 mg by mouth in the morning and  at bedtime.    Marland Kitchen nystatin (MYCOSTATIN/NYSTOP) powder Apply 1 application topically 3 (three) times daily. 15 g 0  . pravastatin (PRAVACHOL) 40 MG tablet Take 40 mg by mouth daily.    . QUEtiapine (SEROQUEL XR) 400 MG 24 hr tablet Take 400 mg by mouth daily.    . sitaGLIPtin (JANUVIA) 100 MG tablet Take 100 mg by mouth daily.     No facility-administered medications prior to visit.      ROS:  Review of Systems BREAST: No symptoms   OBJECTIVE:   Vitals:  There were no vitals taken for this visit.  Physical Exam  Results: No results found for this or any previous visit (from the past 24 hour(s)).   Assessment/Plan: No diagnosis found.    No orders of the defined types were placed in this encounter.     No follow-ups on file.  Karsen Fellows B. Lasya Vetter, PA-C 03/14/2023 11:15 AM

## 2023-03-16 ENCOUNTER — Encounter: Payer: Self-pay | Admitting: Obstetrics and Gynecology

## 2023-03-16 ENCOUNTER — Ambulatory Visit (INDEPENDENT_AMBULATORY_CARE_PROVIDER_SITE_OTHER): Payer: Medicaid Other | Admitting: Obstetrics and Gynecology

## 2023-03-16 VITALS — BP 110/70 | Wt 230.0 lb

## 2023-03-16 DIAGNOSIS — Z1231 Encounter for screening mammogram for malignant neoplasm of breast: Secondary | ICD-10-CM

## 2023-03-16 DIAGNOSIS — N951 Menopausal and female climacteric states: Secondary | ICD-10-CM

## 2023-03-16 MED ORDER — ESTRADIOL 0.025 MG/24HR TD PTWK: 0.0250 mg | MEDICATED_PATCH | TRANSDERMAL | 3 refills | Status: DC

## 2023-03-16 NOTE — Addendum Note (Signed)
Addended by: Althea Grimmer B on: 03/16/2023 01:32 PM   Modules accepted: Orders

## 2023-03-16 NOTE — Patient Instructions (Signed)
I value your feedback and you entrusting us with your care. If you get a [facility] patient survey, I would appreciate you taking the time to let us know about your experience today. Thank you! ? ? ?

## 2023-09-13 ENCOUNTER — Ambulatory Visit
Admission: RE | Admit: 2023-09-13 | Discharge: 2023-09-13 | Disposition: A | Discharge status: Home / Self Care | Payer: Medicaid Other | Source: Ambulatory Visit | Attending: Obstetrics and Gynecology | Admitting: Obstetrics and Gynecology

## 2023-09-13 DIAGNOSIS — Z1231 Encounter for screening mammogram for malignant neoplasm of breast: Secondary | ICD-10-CM | POA: Diagnosis present

## 2023-09-15 ENCOUNTER — Encounter: Payer: Self-pay | Admitting: Obstetrics and Gynecology

## 2023-11-18 LAB — EXTERNAL GENERIC LAB PROCEDURE

## 2023-11-18 LAB — COLOGUARD

## 2023-12-01 ENCOUNTER — Ambulatory Visit: Payer: MEDICAID

## 2023-12-04 ENCOUNTER — Ambulatory Visit: Payer: MEDICAID

## 2023-12-04 ENCOUNTER — Ambulatory Visit: Payer: MEDICAID | Admitting: Nurse Practitioner

## 2023-12-04 DIAGNOSIS — Z719 Counseling, unspecified: Secondary | ICD-10-CM

## 2023-12-04 DIAGNOSIS — Z113 Encounter for screening for infections with a predominantly sexual mode of transmission: Secondary | ICD-10-CM

## 2023-12-04 DIAGNOSIS — B3731 Acute candidiasis of vulva and vagina: Secondary | ICD-10-CM

## 2023-12-04 DIAGNOSIS — Z23 Encounter for immunization: Secondary | ICD-10-CM

## 2023-12-04 LAB — WET PREP FOR TRICH, YEAST, CLUE: Trichomonas Exam: NEGATIVE

## 2023-12-04 LAB — HM HIV SCREENING LAB: HM HIV Screening: NEGATIVE

## 2023-12-04 MED ORDER — CLOTRIMAZOLE 1 % VA CREA: 1.0000 | TOPICAL_CREAM | Freq: Every day | VAGINAL | Status: AC

## 2023-12-04 NOTE — Progress Notes (Signed)
 [facility] Department STI clinic [address] [place] [address] Main phone: [phone]  STI screening visit  Subjective:  BRYANA FROEMMING is a 53 y.o. female being seen today for an STI screening visit. The patient reports they do have symptoms.  Patient reports that they do not desire a pregnancy in the next year.   They reported they are not interested in discussing contraception today.    No LMP recorded. Patient has had an implant.  Patient has the following medical conditions:   Patient Active Problem List   Diagnosis Date Noted   Nexplanon  in place 02/01/2023   Obesity, unspecified 03/17/2016   History of psychological trauma 07/23/2014   Sleep apnea 07/23/2014   Mild cognitive impairment 07/23/2014   Asthma 07/23/2014   Hypertension 07/23/2014   Diabetes mellitus, type II (HCC) 07/23/2014   Schizophrenia (HCC) 07/23/2014   History of abnormal cervical Pap smear 02/28/2006    No chief complaint on file.  Patient is a pleasant 53 y.o. female who presents today with concern of vaginal itching that began this morning. She has not had sex in over a year. When she does she has female partners and practices vaginal sex. Patient has a Nexplanon  implant. It is due to come out in about 6 months (June 2025). She saw an OBGYN about 8 months ago for vasomotor menopausal symptoms and was treated with estrogen patches, which patient indicates she using as prescribed. Patient has no other concerns today.    Does the patient using douching products?Not assessed.   Last HIV test per patient/review of record was  Lab Results  Component Value Date   HMHIVSCREEN Negative - Validated 02/01/2023   No results found for: HIV   Last HEPC test per patient/review of record was  Lab Results  Component Value Date   HMHEPCSCREEN Negative-Validated 05/12/2020   No components found for: HEPC   Last HEPB test per patient/review of record was No components  found for: HMHEPBSCREEN No components found for: HEPC   Patient reports last pap was:  No results found for: DIAGPAP, HPVHIGH, ADEQPAP Lab Results  Component Value Date   SPECADGYN Comment 05/05/2020   Result Date Procedure Results Follow-ups  05/05/2020 IGP, Aptima HPV DIAGNOSIS:: Comment Specimen adequacy:: Comment Clinician Provided ICD10: Comment Performed by:: Comment PAP Smear Comment: . Note:: Comment Test Methodology: Comment HPV Aptima: Negative   09/16/2015 HM PAP SMEAR HM Pap smear: Negative, hPV negative     Screening for MPX risk: Does the patient have an unexplained rash? No Is the patient MSM? No Does the patient endorse multiple sex partners or anonymous sex partners? No Did the patient have close or sexual contact with a person diagnosed with MPX? No Has the patient traveled outside the [place]  where MPX is endemic? No Is there a high clinical suspicion for MPX-- evidenced by one of the following No  -Unlikely to be chickenpox  -Lymphadenopathy  -Rash that present in same phase of evolution on any given body part See flowsheet for further details and programmatic requirements.   Immunization history:  Immunization History  Administered Date(s) Administered   Fluzone Influenza virus vaccine,trivalent (IIV3), split virus 12/04/2023   Hep A / Hep B 06/06/2005, 06/19/2007, 07/23/2014   Influenza,inj,Quad PF,6+ Mos 10/01/2019   Influenza,inj,quad, With Preservative 09/08/2020   Influenza-Unspecified 02/01/2023   MMR 10/03/2001   Pneumococcal Polysaccharide-23 07/23/2009   Tdap 06/19/2007, 12/04/2023     The following portions of the patient's history were  reviewed and updated as appropriate: allergies, current medications, past medical history, past social history, past surgical history and problem list.  Objective:  There were no vitals filed for this visit.  Physical Exam Vitals and nursing note reviewed. Exam conducted with a chaperone present  [facility], FNP-C present as chaperone during PE.).  Constitutional:      Appearance: Normal appearance.  HENT:     Head: Normocephalic.     Salivary Glands: Right salivary gland is not diffusely enlarged or tender. Left salivary gland is not diffusely enlarged or tender.     Mouth/Throat:     Lips: Pink. No lesions.     Mouth: Mucous membranes are moist.     Tongue: No lesions. Tongue does not deviate from midline.     Pharynx: Oropharynx is clear. Uvula midline. No oropharyngeal exudate or posterior oropharyngeal erythema.     Tonsils: No tonsillar exudate.  Eyes:     General:        Right eye: No discharge.        Left eye: No discharge.  Neck:     Trachea: Phonation normal.  Pulmonary:     Effort: Pulmonary effort is normal.  Genitourinary:    Exam position: Lithotomy position.     Pubic Area: No rash or pubic lice.      Tanner stage (genital): 5.     Labia:        Right: No rash, tenderness, lesion or injury.        Left: No rash, tenderness, lesion or injury.      Vagina: Normal. No signs of injury and foreign body. No vaginal discharge, erythema, tenderness, bleeding or lesions.     Cervix: Normal. No cervical motion tenderness, discharge, friability, lesion, erythema, cervical bleeding or eversion.     Uterus: Normal.      Adnexa: Right adnexa normal and left adnexa normal.       Comments: pH=5 No abnormal vaginal discharge present.  Amine odor present.  Skin within area denoted by red lines in diagram is shiny, thin, hairless (without practicing hair removal methods), is bright red in some areas and pink in others.  Internal vagina with minimal rugae.  Lymphadenopathy:     Head:     Right side of head: No submental, submandibular, tonsillar, preauricular or posterior auricular adenopathy.     Left side of head: No submental, submandibular, tonsillar, preauricular or posterior auricular adenopathy.     Cervical: No cervical adenopathy.     Right cervical: No  superficial or posterior cervical adenopathy.    Left cervical: No superficial or posterior cervical adenopathy.     Upper Body:     Right upper body: No supraclavicular or axillary adenopathy.     Left upper body: No supraclavicular or axillary adenopathy.     Lower Body: No right inguinal adenopathy. No left inguinal adenopathy.  Skin:    General: Skin is warm and dry.     Findings: No rash.     Comments: Skin tone appropriate for ethnicity.    Neurological:     Mental Status: She is alert and oriented to person, place, and time.  Psychiatric:        Attention and Perception: Attention normal.        Mood and Affect: Mood and affect normal.        Speech: Speech is delayed.        Behavior: Behavior is slowed. Behavior is cooperative.  Thought Content: Thought content normal.     Assessment and Plan:  EVELISE REINE is a 53 y.o. female presenting to the [facility] Department for STI screening  1. Screening for venereal disease ([place])  - Chlamydia/Gonorrhea [place] [facility] - HIV [place] [facility] - Syphilis Serology, [place] [facility] - WET PREP FOR [place], YEAST, CLUE  2. Vaginal candidiasis Patient presents with symptoms of vaginal itching and has positive yeast on Wet Prep. Will treat for vaginal candidiasis as indicated.  The patient was dispensed 1 tube with applicator of vaginal 1% Clotrimazole  today. I provided counseling today regarding the medication. We discussed the medication, the side effects and when to call clinic. Patient given the opportunity to ask questions. Questions answered.   With patient symptom of itching and vaginal appearance on PE (please see objective section above), I am concerned patient may be experiencing vulvovaginal atrophy. Patient given name and contact information for OBGYN she previously saw who is treating her for menopausal vasomotor symptoms. Patient strongly encouraged to seek further evaluation from that provider as this office is  unable to diagnose and treat menopausal conditions. Patient verbalized understanding and agreed to reach out to the office.   - clotrimazole  (GYNE-LOTRIMIN ) 1 % vaginal cream; Place 1 Applicatorful vaginally at bedtime for 7 days.  Patient accepted screenings including vaginal CT/GC and bloodwork for HIV/RPR, and wet prep. Patient meets criteria for HepB screening? No. Ordered? not applicable Patient meets criteria for HepC screening? No. Ordered? not applicable  Treat wet prep per standing order Discussed time line for [facility] results and that patient will be called with positive results and encouraged patient to call if she had not heard in 2 weeks.  Counseled to return or seek care for continued or worsening symptoms Recommended repeat testing in 3 months with positive results. Recommended condom use with all sex for STI prevention.   Patient is currently using Hormonal Contraception: Injection, Rings and Patches, *Nexplanon  to prevent pregnancy.    Return if symptoms worsen or fail to improve.  No future appointments.  Total time with patient 30 minutes.   Clarita LITTIE Narrow, NP

## 2023-12-04 NOTE — Progress Notes (Signed)
 Patient seen today in STI clinic and requests Flu and Tdap today.  Patient states she has not had flu vaccine this season and per [facility], last Tdap 2008.  These vaccines were given and tolerated well. Updated [facility] copy given and explained. Susan Bleich, RN

## 2023-12-26 ENCOUNTER — Telehealth: Payer: Self-pay

## 2023-12-26 DIAGNOSIS — N951 Menopausal and female climacteric states: Secondary | ICD-10-CM

## 2023-12-26 MED ORDER — ESTRADIOL 0.025 MG/24HR TD PTWK: 0.0250 mg | MEDICATED_PATCH | TRANSDERMAL | 0 refills | Status: DC

## 2023-12-26 NOTE — Telephone Encounter (Signed)
Chart reviewed. Patient has upcoming appointment 03/27/23. Refill

## 2023-12-27 LAB — EXTERNAL GENERIC LAB PROCEDURE: COLOGUARD: NEGATIVE

## 2023-12-27 LAB — COLOGUARD: COLOGUARD: NEGATIVE

## 2024-02-12 ENCOUNTER — Telehealth: Payer: Self-pay

## 2024-02-12 DIAGNOSIS — N951 Menopausal and female climacteric states: Secondary | ICD-10-CM

## 2024-02-12 MED ORDER — ESTRADIOL 0.025 MG/24HR TD PTWK: 0.0250 mg | MEDICATED_PATCH | TRANSDERMAL | 0 refills | Status: DC

## 2024-02-12 NOTE — Telephone Encounter (Signed)
 Appt 03/26/24. Refill sent.

## 2024-03-04 ENCOUNTER — Other Ambulatory Visit: Payer: Self-pay | Admitting: Obstetrics and Gynecology

## 2024-03-04 DIAGNOSIS — N951 Menopausal and female climacteric states: Secondary | ICD-10-CM

## 2024-03-25 NOTE — Progress Notes (Deleted)
 [employer]   No chief complaint on file.   HPI:      [name] is a 53 y.o. G0P0000 whose LMP was No LMP recorded. Patient has had an implant., presents today for *** estradiol  (CLIMARA  - DOSED IN MG/24 HR) 0.025 mg/24hr patch; discussed normal to have sx, already on progesterone with nexplanon . Will add low dose ERT patch to see if helps sx. Aware to give it 12 wks for max effectiveness. Rx eRxd. PCP can manage or pt to RTO in 1 yr. When nexplanon  removed, pt will need addl prog Rx (unless nexplanon  replaced). F/u prn.  03/16/23 NOTE:  NP eval of hot flashes/night sweats, referred by PCP. Pt states sx are bad, has had them for many yrs, would like tx. Hasn't tried any meds for sx. Has nexplanon  (has 1 yr left per [facility]), no vaginal bleeding. Has normal TSH on levo; DM well controlled per pt. She is not sexually active. No FH breast/ovar cancer. Hx of HTN.  Neg pap 6/21.  Patient Active Problem List   Diagnosis Date Noted   Nexplanon  in place 02/01/2023   Obesity, unspecified 03/17/2016   History of psychological trauma 07/23/2014   Sleep apnea 07/23/2014   Mild cognitive impairment 07/23/2014   Asthma 07/23/2014   Hypertension 07/23/2014   Diabetes mellitus, type II (HCC) 07/23/2014   Schizophrenia (HCC) 07/23/2014   History of abnormal cervical Pap smear 02/28/2006    Past Surgical History:  Procedure Laterality Date   Denies surgical history      Family History  Problem Relation Age of Onset   Diabetes Mother    Hypertension Mother    Sleep apnea Father    Heart attack Father    Gout Maternal Grandfather    Breast cancer Neg Hx     Social History   Socioeconomic History   Marital status: Single    Spouse name: Not on file   Number of children: 0   Years of education: Not on file   Highest education level: High school graduate  Occupational History   Not on file  Tobacco Use   Smoking status: Never   Smokeless tobacco: Current     Types: Chew, Snuff   Tobacco comments:    Reports remote history of cigarette use. Unsure what year quit as has been so long ago.  Vaping Use   Vaping status: Never Used  Substance and Sexual Activity   Alcohol use: Not Currently   Drug use: Never   Sexual activity: Not Currently    Partners: Male    Birth control/protection: Implant    Comment: No sex in over a year  Other Topics Concern   Not on file  Social History Narrative   Not on file   Social Drivers of Health   Financial Resource Strain: Not on file  Food Insecurity: Not on file  Transportation Needs: Not on file  Physical Activity: Not on file  Stress: Not on file  Social Connections: Not on file  Intimate Partner Violence: Not At Risk (05/05/2020)   Humiliation, Afraid, Rape, and Kick questionnaire    Fear of Current or Ex-Partner: No    Emotionally Abused: No    Physically Abused: No    Sexually Abused: No    Outpatient Medications Prior to Visit  Medication Sig Dispense Refill   amLODipine (NORVASC) 10 MG tablet Take 10 mg by mouth daily.     carvedilol (COREG) 25 MG tablet Take  25 mg by mouth daily.     clobetasol  ointment (TEMOVATE ) 0.05 % Apply once a day for 4 weeks, then every other day for 4 weeks, then once a week as needed. 30 g 5   cloNIDine (CATAPRES) 0.1 MG tablet Take 0.1 mg by mouth daily.     estradiol  (CLIMARA  - DOSED IN MG/24 HR) 0.025 mg/24hr patch Place 1 patch (0.025 mg total) onto the skin once a week. 4 patch 0   etonogestrel  (NEXPLANON ) 68 MG IMPL implant 1 each by Subdermal route once.     furosemide (LASIX) 20 MG tablet Take 20 mg by mouth daily.     glipiZIDE (GLUCOTROL XL) 10 MG 24 hr tablet Take 10 mg by mouth daily.     hydrochlorothiazide (HYDRODIURIL) 25 MG tablet Take 25 mg by mouth daily.     insulin glargine (LANTUS) 100 UNIT/ML injection Inject 50 Units into the skin at bedtime.     levothyroxine (SYNTHROID) 75 MCG tablet Take 75 mcg by mouth daily.     lisinopril (ZESTRIL) 40  MG tablet Take 40 mg by mouth daily.     metFORMIN (GLUCOPHAGE) 1000 MG tablet Take 1,000 mg by mouth in the morning and at bedtime.     nystatin  (MYCOSTATIN /NYSTOP ) powder Apply 1 application topically 3 (three) times daily. 15 g 0   pravastatin (PRAVACHOL) 40 MG tablet Take 40 mg by mouth daily.     QUEtiapine (SEROQUEL XR) 400 MG 24 hr tablet Take 400 mg by mouth daily.     sitaGLIPtin (JANUVIA) 100 MG tablet Take 100 mg by mouth daily.     No facility-administered medications prior to visit.      ROS:  Review of Systems BREAST: No symptoms   OBJECTIVE:   Vitals:  There were no vitals taken for this visit.  Physical Exam  Results: No results found for this or any previous visit (from the past 24 hours).   Assessment/Plan: No diagnosis found.    No orders of the defined types were placed in this encounter.     No follow-ups on file.  Mackenzie Randall B. Parke Jandreau, PA-C 03/25/2024 1:17 PM

## 2024-03-26 ENCOUNTER — Ambulatory Visit: Payer: MEDICAID | Admitting: Obstetrics and Gynecology

## 2024-03-26 DIAGNOSIS — Z1151 Encounter for screening for human papillomavirus (HPV): Secondary | ICD-10-CM

## 2024-03-26 DIAGNOSIS — Z124 Encounter for screening for malignant neoplasm of cervix: Secondary | ICD-10-CM

## 2024-03-26 DIAGNOSIS — Z3046 Encounter for surveillance of implantable subdermal contraceptive: Secondary | ICD-10-CM

## 2024-03-26 DIAGNOSIS — Z79890 Hormone replacement therapy (postmenopausal): Secondary | ICD-10-CM

## 2024-03-28 ENCOUNTER — Encounter: Payer: Self-pay | Admitting: Obstetrics and Gynecology

## 2024-03-29 NOTE — Progress Notes (Unsigned)
 Sleep Medicine   Office Visit  Patient Name: Mackenzie Randall DOB: 26-Aug-1971 MRN [id number]    Chief Complaint: history of OSA   Brief History:  Mackenzie Randall presents for an initial consult for sleep evaluation and to establish care. The patient has a longstanding history of sleep apnea and is currently on a CPAP. Prior to using a PAP, sleep quality was poor. This was noted most night. The patient reported the following symptoms:  snoring, fatigue, headaches, trouble concentrating and brain fog . The patient goes to sleep at 0930 pm and wakes up at 0830 am. The patient depression and schizophrenia as a history of psychiatric problems. The Epworth Sleepiness Score is 12 out of 24 . The patient relates  Cardiovascular risk factors include: hypertension. The patient is currently using a PAP machine but did no bring to appointment. Unable to download  data. Patient is in need of a replacement unit as her unit is old and obsolete for repair. Patient's unit is also out of warranty. In order to assure reliable treatment for OSA, patient's machine should be replaced.  The patient reports using her PAP and feels rested after sleeping with PAP.  The patient reports benefiting from PAP use and would like for her to continue using PAP. Reported sleepiness is  improved. The patient continues to require PAP therapy as a medical necessity in order to eliminate her sleep apnea.    ROS  General: (-) fever, (-) chills, (-) night sweat Nose and Sinuses: (-) nasal stuffiness or itchiness, (-) postnasal drip, (-) nosebleeds, (-) sinus trouble. Mouth and Throat: (-) sore throat, (-) hoarseness. Neck: (-) swollen glands, (-) enlarged thyroid , (-) neck pain. Respiratory: + cough, - shortness of breath, -- wheezing. Neurologic: - numbness, - tingling. Psychiatric: + anxiety, + depression Sleep behavior: -sleep paralysis -hypnogogic hallucinations -dream enactment      -vivid dreams -cataplexy -night terrors -sleep  walking   Current Medication: Outpatient Encounter Medications as of 04/01/2024  Medication Sig   buPROPion (WELLBUTRIN XL) 150 MG 24 hr tablet Take 150 mg by mouth daily.   cetirizine (ZYRTEC) 10 MG tablet Take 10 mg by mouth daily.   desvenlafaxine (PRISTIQ) 100 MG 24 hr tablet Take 100 mg by mouth daily.   divalproex (DEPAKOTE ER) 500 MG 24 hr tablet Take 500 mg by mouth 3 (three) times daily.   fluticasone (FLONASE) 50 MCG/ACT nasal spray Place into both nostrils.   gabapentin (NEURONTIN) 100 MG capsule Take by mouth.   KERENDIA 20 MG TABS Take by mouth.   KLOR-CON M20 20 MEQ tablet Take by mouth.   LANTUS SOLOSTAR 100 UNIT/ML Solostar Pen Inject into the skin.   levothyroxine (SYNTHROID) 100 MCG tablet Take 100 mcg by mouth daily.   methylphenidate (RITALIN) 10 MG tablet Take 10 mg by mouth daily.   metolazone (ZAROXOLYN) 2.5 MG tablet Take 2.5 mg by mouth daily.   omeprazole (PRILOSEC) 20 MG capsule Take 20 mg by mouth daily.   QUEtiapine (SEROQUEL) 50 MG tablet Take by mouth.   STIOLTO RESPIMAT 2.5-2.5 MCG/ACT AERS SMARTSIG:2 Puff(s) Via Inhaler Daily   traZODone (DESYREL) 100 MG tablet Take 100 mg by mouth at bedtime.   ziprasidone (GEODON) 80 MG capsule Take 80 mg by mouth 2 (two) times daily.   carvedilol (COREG) 25 MG tablet Take 25 mg by mouth daily.   clobetasol  ointment (TEMOVATE ) 0.05 % Apply once a day for 4 weeks, then every other day for 4 weeks, then once a week as  needed.   cloNIDine (CATAPRES) 0.1 MG tablet Take 0.1 mg by mouth daily.   estradiol  (CLIMARA  - DOSED IN MG/24 HR) 0.025 mg/24hr patch Place 1 patch (0.025 mg total) onto the skin once a week.   etonogestrel  (NEXPLANON ) 68 MG IMPL implant 1 each by Subdermal route once.   furosemide (LASIX) 20 MG tablet Take 20 mg by mouth daily.   hydrochlorothiazide (HYDRODIURIL) 25 MG tablet Take 25 mg by mouth daily.   JARDIANCE 25 MG TABS tablet Take 25 mg by mouth daily.   lisinopril (ZESTRIL) 40 MG tablet Take 40 mg  by mouth daily.   meloxicam (MOBIC) 15 MG tablet Take 15 mg by mouth daily.   nystatin  (MYCOSTATIN /NYSTOP ) powder Apply 1 application topically 3 (three) times daily.   pravastatin (PRAVACHOL) 40 MG tablet Take 40 mg by mouth daily.   sitaGLIPtin (JANUVIA) 100 MG tablet Take 100 mg by mouth daily.   TRULICITY 1.5 MG/0.5ML SOAJ Inject into the skin.   [DISCONTINUED] amLODipine (NORVASC) 10 MG tablet Take 10 mg by mouth daily.   [DISCONTINUED] glipiZIDE (GLUCOTROL XL) 10 MG 24 hr tablet Take 10 mg by mouth daily.   [DISCONTINUED] insulin glargine (LANTUS) 100 UNIT/ML injection Inject 50 Units into the skin at bedtime.   [DISCONTINUED] levothyroxine (SYNTHROID) 75 MCG tablet Take 75 mcg by mouth daily.   [DISCONTINUED] metFORMIN (GLUCOPHAGE) 1000 MG tablet Take 1,000 mg by mouth in the morning and at bedtime.   [DISCONTINUED] QUEtiapine (SEROQUEL XR) 400 MG 24 hr tablet Take 400 mg by mouth daily.   No facility-administered encounter medications on file as of 04/01/2024.    Surgical History: Past Surgical History:  Procedure Laterality Date   Denies surgical history      Medical History: Past Medical History:  Diagnosis Date   Diabetes mellitus without complication (HCC)    Hypertension     Family History: Non contributory to the present illness  Social History: Social History   Socioeconomic History   Marital status: Single    Spouse name: Not on file   Number of children: 0   Years of education: Not on file   Highest education level: High school graduate  Occupational History   Not on file  Tobacco Use   Smoking status: Never   Smokeless tobacco: Current    Types: Chew, Snuff   Tobacco comments:    Reports remote history of cigarette use. Unsure what year quit as has been so long ago.  Vaping Use   Vaping status: Never Used  Substance and Sexual Activity   Alcohol use: Not Currently   Drug use: Never   Sexual activity: Not Currently    Partners: Male    Birth  control/protection: Implant    Comment: No sex in over a year  Other Topics Concern   Not on file  Social History Narrative   Not on file   Social Drivers of Health   Financial Resource Strain: Not on file  Food Insecurity: Not on file  Transportation Needs: Not on file  Physical Activity: Not on file  Stress: Not on file  Social Connections: Not on file  Intimate Partner Violence: Not At Risk (05/05/2020)   Humiliation, Afraid, Rape, and Kick questionnaire    Fear of Current or Ex-Partner: No    Emotionally Abused: No    Physically Abused: No    Sexually Abused: No    Vital Signs: Blood pressure 132/72, pulse 70, height 5\' 5"  (1.651 m), weight 255 lb (115.7 kg).  Body mass index is 42.43 kg/m.   Examination: General Appearance: The patient is well-developed, well-nourished, and in no distress. Neck Circumference: 40 cm Skin: Gross inspection of skin unremarkable. Head: normocephalic, no gross deformities. Eyes: no gross deformities noted. ENT: ears appear grossly normal Neurologic: Alert and oriented. No involuntary movements.    STOP BANG RISK ASSESSMENT S (snore) Have you been told that you snore?     NO   T (tired) Are you often tired, fatigued, or sleepy during the day?   YES  O (obstruction) Do you stop breathing, choke, or gasp during sleep? NO   P (pressure) Do you have or are you being treated for high blood pressure? YES   B (BMI) Is your body index greater than 35 kg/m? YES   A (age) Are you 32 years old or older? YES   N (neck) Do you have a neck circumference greater than 16 inches?   YES   G (gender) Are you a female? NO   TOTAL STOP/BANG "YES" ANSWERS 5                                                               A STOP-Bang score of 2 or less is considered low risk, and a score of 5 or more is high risk for having either moderate or severe OSA. For people who score 3 or 4, doctors may need to perform further assessment to determine how likely  they are to have OSA.         EPWORTH SLEEPINESS SCALE:  Scale:  (0)= no chance of dozing; (1)= slight chance of dozing; (2)= moderate chance of dozing; (3)= high chance of dozing  Chance  Situtation    Sitting and reading: 1    Watching TV: 0    Sitting Inactive in public: 2    As a passenger in car: 3      Lying down to rest: 1    Sitting and talking: 0    Sitting quielty after lunch: 2    In a car, stopped in traffic: 3   TOTAL SCORE:   12 out of 24    SLEEP STUDIES:  Not available   LABS: No results found for this or any previous visit (from the past 2160 hours).  Radiology: MM 3D SCREENING MAMMOGRAM BILATERAL BREAST Result Date: 09/15/2023 CLINICAL DATA:  Screening. EXAM: DIGITAL SCREENING BILATERAL MAMMOGRAM WITH TOMOSYNTHESIS AND CAD TECHNIQUE: Bilateral screening digital craniocaudal and mediolateral oblique mammograms were obtained. Bilateral screening digital breast tomosynthesis was performed. The images were evaluated with computer-aided detection. COMPARISON:  Previous exam(s). ACR Breast Density Category a: The breasts are almost entirely fatty. FINDINGS: There are no findings suspicious for malignancy. IMPRESSION: No mammographic evidence of malignancy. A result letter of this screening mammogram will be mailed directly to the patient. RECOMMENDATION: Screening mammogram in one year. (Code:SM-B-01Y) BI-RADS CATEGORY  1: Negative. Electronically Signed   By: Alinda Apley M.D.   On: 09/15/2023 09:48    No results found.  No results found.    Assessment and Plan: Patient Active Problem List   Diagnosis Date Noted   OSA (obstructive sleep apnea) 04/01/2024   Nexplanon  in place 02/01/2023   Obesity, unspecified 03/17/2016   History of psychological trauma 07/23/2014  Sleep apnea 07/23/2014   Mild cognitive impairment 07/23/2014   Asthma 07/23/2014   Hypertension 07/23/2014   Diabetes mellitus, type II (HCC) 07/23/2014   Schizophrenia  (HCC) 07/23/2014   History of abnormal cervical Pap smear 02/28/2006   1. OSA (obstructive sleep apnea) (Primary) PLAN OSA:   Patient evaluation suggests high risk of sleep disordered breathing due to history of OSA, no prior studies available. Patient needs a new machine. Without use of cpap patient has daytime sleepiness, snoring, headaches, daytime sleepiness.  Patient has comorbid cardiovascular risk factors including: hypertension which could be exacerbated by pathologic sleep-disordered breathing.  Suggest: PSG to assess/treat the patient's sleep disordered breathing. The patient was also counselled on weight loss.  to optimize sleep health. After her study, patient will need a new cpap machine. F/u after setup.   2. Morbid obesity (HCC) Obesity Counseling: Had a lengthy discussion regarding patients BMI and weight issues. Patient was instructed on portion control as well as increased activity. Also discussed caloric restrictions with trying to maintain intake less than 2000 Kcal. Discussions were made in accordance with the 5As of weight management. Simple actions such as not eating late and if able to, taking a walk is suggested.   3. Primary hypertension Controlled with carvedilol. Continue.     General Counseling: I have discussed the findings of the evaluation and examination with Genevia Kern.  I have also discussed any further diagnostic evaluation thatmay be needed or ordered today. Maxcine verbalizes understanding of the findings of todays visit. We also reviewed her medications today and discussed drug interactions and side effects including but not limited excessive drowsiness and altered mental states. We also discussed that there is always a risk not just to her but also people around her. she has been encouraged to call the office with any questions or concerns that should arise related to todays visit.  No orders of the defined types were placed in this encounter.       I have  personally obtained a history, evaluated the patient, evaluated pertinent data, formulated the assessment and plan and placed orders.   This patient was seen today by Louann Rous, PA-C in collaboration with Dr. Cam Cava.   Cordie Deters, MD [facility] Diplomate ABMS Pulmonary and Critical Care Medicine Sleep medicine

## 2024-04-01 ENCOUNTER — Ambulatory Visit (INDEPENDENT_AMBULATORY_CARE_PROVIDER_SITE_OTHER): Payer: MEDICAID | Admitting: Internal Medicine

## 2024-04-01 VITALS — BP 132/72 | HR 70 | Ht 65.0 in | Wt 255.0 lb

## 2024-04-01 DIAGNOSIS — I10 Essential (primary) hypertension: Secondary | ICD-10-CM

## 2024-04-01 DIAGNOSIS — G4733 Obstructive sleep apnea (adult) (pediatric): Secondary | ICD-10-CM

## 2024-04-19 ENCOUNTER — Other Ambulatory Visit: Payer: Self-pay

## 2024-04-19 DIAGNOSIS — N951 Menopausal and female climacteric states: Secondary | ICD-10-CM

## 2024-04-19 MED ORDER — ESTRADIOL 0.025 MG/24HR TD PTWK: 0.0250 mg | MEDICATED_PATCH | TRANSDERMAL | 0 refills | Status: DC

## 2024-05-13 NOTE — Progress Notes (Deleted)
 [employer]   No chief complaint on file.   HPI:      [name] is a 53 y.o. G0P0000 whose LMP was No LMP recorded. Patient has had an implant., presents today for *** Neg pap/neg HPV 6/21 Neg mammo  09/13/23  hot flashes/night sweats, referred by PCP. Pt states sx are bad, has had them for many yrs, would like tx. Hasn't tried any meds for sx. Has nexplanon  (has 1 yr left per [facility]), no vaginal bleeding. Has normal TSH on levo; DM well controlled per pt. She is not sexually active. No FH breast/ovar cancer. Hx of HTN.   Perimenopausal vasomotor symptoms - Plan: estradiol  (CLIMARA  - DOSED IN MG/24 HR) 0.025 mg/24hr patch; discussed normal to have sx, already on progesterone with nexplanon . Will add low dose ERT patch to see if helps sx. Aware to give it 12 wks for max effectiveness. Rx eRxd. PCP can manage or pt to RTO in 1 yr. When nexplanon  removed, pt will need addl prog Rx (unless nexplanon  replaced). F/u prn.   Patient Active Problem List   Diagnosis Date Noted   OSA (obstructive sleep apnea) 04/01/2024   Nexplanon  in place 02/01/2023   Obesity, unspecified 03/17/2016   History of psychological trauma 07/23/2014   Sleep apnea 07/23/2014   Mild cognitive impairment 07/23/2014   Asthma 07/23/2014   Hypertension 07/23/2014   Diabetes mellitus, type II (HCC) 07/23/2014   Schizophrenia (HCC) 07/23/2014   History of abnormal cervical Pap smear 02/28/2006    Past Surgical History:  Procedure Laterality Date   Denies surgical history      Family History  Problem Relation Age of Onset   Diabetes Mother    Hypertension Mother    Sleep apnea Father    Heart attack Father    Gout Maternal Grandfather    Breast cancer Neg Hx     Social History   Socioeconomic History   Marital status: Single    Spouse name: Not on file   Number of children: 0   Years of education: Not on file   Highest education level: High school graduate  Occupational  History   Not on file  Tobacco Use   Smoking status: Never   Smokeless tobacco: Current    Types: Chew, Snuff   Tobacco comments:    Reports remote history of cigarette use. Unsure what year quit as has been so long ago.  Vaping Use   Vaping status: Never Used  Substance and Sexual Activity   Alcohol use: Not Currently   Drug use: Never   Sexual activity: Not Currently    Partners: Male    Birth control/protection: Implant    Comment: No sex in over a year  Other Topics Concern   Not on file  Social History Narrative   Not on file   Social Drivers of Health   Financial Resource Strain: Not on file  Food Insecurity: Not on file  Transportation Needs: Not on file  Physical Activity: Not on file  Stress: Not on file  Social Connections: Not on file  Intimate Partner Violence: Not At Risk (05/05/2020)   Humiliation, Afraid, Rape, and Kick questionnaire    Fear of Current or Ex-Partner: No    Emotionally Abused: No    Physically Abused: No    Sexually Abused: No    Outpatient Medications Prior to Visit  Medication Sig Dispense Refill   buPROPion (WELLBUTRIN XL) 150 MG 24 hr tablet  Take 150 mg by mouth daily.     carvedilol (COREG) 25 MG tablet Take 25 mg by mouth daily.     cetirizine (ZYRTEC) 10 MG tablet Take 10 mg by mouth daily.     clobetasol  ointment (TEMOVATE ) 0.05 % Apply once a day for 4 weeks, then every other day for 4 weeks, then once a week as needed. 30 g 5   cloNIDine (CATAPRES) 0.1 MG tablet Take 0.1 mg by mouth daily.     desvenlafaxine (PRISTIQ) 100 MG 24 hr tablet Take 100 mg by mouth daily.     divalproex (DEPAKOTE ER) 500 MG 24 hr tablet Take 500 mg by mouth 3 (three) times daily.     estradiol  (CLIMARA  - DOSED IN MG/24 HR) 0.025 mg/24hr patch Place 1 patch (0.025 mg total) onto the skin once a week. 4 patch 0   etonogestrel  (NEXPLANON ) 68 MG IMPL implant 1 each by Subdermal route once.     fluticasone (FLONASE) 50 MCG/ACT nasal spray Place into both  nostrils.     furosemide (LASIX) 20 MG tablet Take 20 mg by mouth daily.     gabapentin (NEURONTIN) 100 MG capsule Take by mouth.     hydrochlorothiazide (HYDRODIURIL) 25 MG tablet Take 25 mg by mouth daily.     JARDIANCE 25 MG TABS tablet Take 25 mg by mouth daily.     KERENDIA 20 MG TABS Take by mouth.     KLOR-CON M20 20 MEQ tablet Take by mouth.     LANTUS SOLOSTAR 100 UNIT/ML Solostar Pen Inject into the skin.     levothyroxine (SYNTHROID) 100 MCG tablet Take 100 mcg by mouth daily.     lisinopril (ZESTRIL) 40 MG tablet Take 40 mg by mouth daily.     meloxicam (MOBIC) 15 MG tablet Take 15 mg by mouth daily.     methylphenidate (RITALIN) 10 MG tablet Take 10 mg by mouth daily.     metolazone (ZAROXOLYN) 2.5 MG tablet Take 2.5 mg by mouth daily.     nystatin  (MYCOSTATIN /NYSTOP ) powder Apply 1 application topically 3 (three) times daily. 15 g 0   omeprazole (PRILOSEC) 20 MG capsule Take 20 mg by mouth daily.     pravastatin (PRAVACHOL) 40 MG tablet Take 40 mg by mouth daily.     QUEtiapine (SEROQUEL) 50 MG tablet Take by mouth.     sitaGLIPtin (JANUVIA) 100 MG tablet Take 100 mg by mouth daily.     STIOLTO RESPIMAT 2.5-2.5 MCG/ACT AERS SMARTSIG:2 Puff(s) Via Inhaler Daily     traZODone (DESYREL) 100 MG tablet Take 100 mg by mouth at bedtime.     TRULICITY 1.5 MG/0.5ML SOAJ Inject into the skin.     ziprasidone (GEODON) 80 MG capsule Take 80 mg by mouth 2 (two) times daily.     No facility-administered medications prior to visit.      ROS:  Review of Systems BREAST: No symptoms   OBJECTIVE:   Vitals:  There were no vitals taken for this visit.  Physical Exam  Results: No results found for this or any previous visit (from the past 24 hours).   Assessment/Plan: No diagnosis found.    No orders of the defined types were placed in this encounter.     No follow-ups on file.  Mackenzie Patty B. Mackenzie Gilleland, PA-C 05/13/2024 8:14 PM

## 2024-05-14 ENCOUNTER — Ambulatory Visit: Payer: MEDICAID | Admitting: Obstetrics and Gynecology

## 2024-05-17 ENCOUNTER — Other Ambulatory Visit: Payer: Self-pay | Admitting: Obstetrics and Gynecology

## 2024-05-17 DIAGNOSIS — N951 Menopausal and female climacteric states: Secondary | ICD-10-CM

## 2024-05-20 ENCOUNTER — Encounter: Payer: Self-pay | Admitting: Obstetrics and Gynecology

## 2024-05-27 ENCOUNTER — Encounter: Payer: Self-pay | Admitting: Emergency Medicine

## 2024-05-28 ENCOUNTER — Other Ambulatory Visit: Payer: Self-pay | Admitting: Obstetrics and Gynecology

## 2024-05-28 DIAGNOSIS — Z1231 Encounter for screening mammogram for malignant neoplasm of breast: Secondary | ICD-10-CM

## 2024-06-14 ENCOUNTER — Other Ambulatory Visit: Payer: Self-pay | Admitting: Nephrology

## 2024-06-14 DIAGNOSIS — M1000 Idiopathic gout, unspecified site: Secondary | ICD-10-CM

## 2024-06-14 DIAGNOSIS — D631 Anemia in chronic kidney disease: Secondary | ICD-10-CM

## 2024-06-14 DIAGNOSIS — E1122 Type 2 diabetes mellitus with diabetic chronic kidney disease: Secondary | ICD-10-CM

## 2024-06-14 DIAGNOSIS — I10 Essential (primary) hypertension: Secondary | ICD-10-CM

## 2024-06-14 DIAGNOSIS — N281 Cyst of kidney, acquired: Secondary | ICD-10-CM

## 2024-06-14 DIAGNOSIS — R8290 Unspecified abnormal findings in urine: Secondary | ICD-10-CM

## 2024-06-14 DIAGNOSIS — E785 Hyperlipidemia, unspecified: Secondary | ICD-10-CM

## 2024-06-18 NOTE — Progress Notes (Unsigned)
 PCP: [facility]   No chief complaint on file.   HPI:      [name] is a 53 y.o. G0P0000 whose LMP was No LMP recorded. Patient has had an implant., presents today for her annual examination.  Her menses are {norm/abn:715}, lasting {number: 22536} days.  Dysmenorrhea {dysmen:716}. She {does:18564} have intermenstrual bleeding.  On ERT climara  for VS sx; had nexplanon  lat yr, good till this yr???? She {does:18564} have vasomotor sx.   Sex activity: {sex active: 315163}. She {does:18564} have vaginal dryness.  Last Pap: 05/05/20 Results were: no abnormalities /neg HPV DNA.  Hx of STDs: {STD hx:14358}  Last mammogram: 09/13/23  Results were: normal--routine follow-up in 12 months; has appt 10/25 There is no FH of breast cancer. There is no FH of ovarian cancer. The patient {does:18564} do self-breast exams.  Colonoscopy: never; NEG Cologuard 1/25, Repeat due after 3 years.   Tobacco use: {tob:20664} Alcohol use: {Alcohol:11675} No drug use Exercise: {exercise:31265}  She {does:18564} get adequate calcium and Vitamin D in her diet.  Labs with PCP.   Patient Active Problem List   Diagnosis Date Noted   OSA (obstructive sleep apnea) 04/01/2024   Nexplanon  in place 02/01/2023   Obesity, unspecified 03/17/2016   History of psychological trauma 07/23/2014   Sleep apnea 07/23/2014   Mild cognitive impairment 07/23/2014   Asthma 07/23/2014   Hypertension 07/23/2014   Diabetes mellitus, type II (HCC) 07/23/2014   Schizophrenia (HCC) 07/23/2014   History of abnormal cervical Pap smear 02/28/2006    Past Surgical History:  Procedure Laterality Date   Denies surgical history      Family History  Problem Relation Age of Onset   Diabetes Mother    Hypertension Mother    Sleep apnea Father    Heart attack Father    Gout Maternal Grandfather    Breast cancer Neg Hx     Social History   Socioeconomic History   Marital status: Single    Spouse  name: Not on file   Number of children: 0   Years of education: Not on file   Highest education level: High school graduate  Occupational History   Not on file  Tobacco Use   Smoking status: Never   Smokeless tobacco: Current    Types: Chew, Snuff   Tobacco comments:    Reports remote history of cigarette use. Unsure what year quit as has been so long ago.  Vaping Use   Vaping status: Never Used  Substance and Sexual Activity   Alcohol use: Not Currently   Drug use: Never   Sexual activity: Not Currently    Partners: Male    Birth control/protection: Implant    Comment: No sex in over a year  Other Topics Concern   Not on file  Social History Narrative   Not on file   Social Drivers of Health   Financial Resource Strain: Not on file  Food Insecurity: Not on file  Transportation Needs: Not on file  Physical Activity: Not on file  Stress: Not on file  Social Connections: Not on file  Intimate Partner Violence: Not At Risk (05/05/2020)   Humiliation, Afraid, Rape, and Kick questionnaire    Fear of Current or Ex-Partner: No    Emotionally Abused: No    Physically Abused: No    Sexually Abused: No     Current Outpatient Medications:    buPROPion (WELLBUTRIN XL) 150 MG 24 hr tablet, Take 150 mg  by mouth daily., Disp: , Rfl:    carvedilol (COREG) 25 MG tablet, Take 25 mg by mouth daily., Disp: , Rfl:    cetirizine (ZYRTEC) 10 MG tablet, Take 10 mg by mouth daily., Disp: , Rfl:    clobetasol  ointment (TEMOVATE ) 0.05 %, Apply once a day for 4 weeks, then every other day for 4 weeks, then once a week as needed., Disp: 30 g, Rfl: 5   cloNIDine (CATAPRES) 0.1 MG tablet, Take 0.1 mg by mouth daily., Disp: , Rfl:    desvenlafaxine (PRISTIQ) 100 MG 24 hr tablet, Take 100 mg by mouth daily., Disp: , Rfl:    divalproex (DEPAKOTE ER) 500 MG 24 hr tablet, Take 500 mg by mouth 3 (three) times daily., Disp: , Rfl:    estradiol  (CLIMARA  - DOSED IN MG/24 HR) 0.025 mg/24hr patch, Place 1  patch (0.025 mg total) onto the skin once a week., Disp: 4 patch, Rfl: 0   etonogestrel  (NEXPLANON ) 68 MG IMPL implant, 1 each by Subdermal route once., Disp: , Rfl:    fluticasone (FLONASE) 50 MCG/ACT nasal spray, Place into both nostrils., Disp: , Rfl:    furosemide (LASIX) 20 MG tablet, Take 20 mg by mouth daily., Disp: , Rfl:    gabapentin (NEURONTIN) 100 MG capsule, Take by mouth., Disp: , Rfl:    hydrochlorothiazide (HYDRODIURIL) 25 MG tablet, Take 25 mg by mouth daily., Disp: , Rfl:    JARDIANCE 25 MG TABS tablet, Take 25 mg by mouth daily., Disp: , Rfl:    KERENDIA 20 MG TABS, Take by mouth., Disp: , Rfl:    KLOR-CON M20 20 MEQ tablet, Take by mouth., Disp: , Rfl:    LANTUS SOLOSTAR 100 UNIT/ML Solostar Pen, Inject into the skin., Disp: , Rfl:    levothyroxine (SYNTHROID) 100 MCG tablet, Take 100 mcg by mouth daily., Disp: , Rfl:    lisinopril (ZESTRIL) 40 MG tablet, Take 40 mg by mouth daily., Disp: , Rfl:    meloxicam (MOBIC) 15 MG tablet, Take 15 mg by mouth daily., Disp: , Rfl:    methylphenidate (RITALIN) 10 MG tablet, Take 10 mg by mouth daily., Disp: , Rfl:    metolazone (ZAROXOLYN) 2.5 MG tablet, Take 2.5 mg by mouth daily., Disp: , Rfl:    nystatin  (MYCOSTATIN /NYSTOP ) powder, Apply 1 application topically 3 (three) times daily., Disp: 15 g, Rfl: 0   omeprazole (PRILOSEC) 20 MG capsule, Take 20 mg by mouth daily., Disp: , Rfl:    pravastatin (PRAVACHOL) 40 MG tablet, Take 40 mg by mouth daily., Disp: , Rfl:    QUEtiapine (SEROQUEL) 50 MG tablet, Take by mouth., Disp: , Rfl:    sitaGLIPtin (JANUVIA) 100 MG tablet, Take 100 mg by mouth daily., Disp: , Rfl:    STIOLTO RESPIMAT 2.5-2.5 MCG/ACT AERS, SMARTSIG:2 Puff(s) Via Inhaler Daily, Disp: , Rfl:    traZODone (DESYREL) 100 MG tablet, Take 100 mg by mouth at bedtime., Disp: , Rfl:    TRULICITY 1.5 MG/0.5ML SOAJ, Inject into the skin., Disp: , Rfl:    ziprasidone (GEODON) 80 MG capsule, Take 80 mg by mouth 2 (two) times daily.,  Disp: , Rfl:      ROS:  Review of Systems BREAST: No symptoms    Objective: There were no vitals taken for this visit.   OBGyn Exam  Results: No results found for this or any previous visit (from the past 24 hours).  Assessment/Plan:  No diagnosis found.   No orders of the defined types were  placed in this encounter.           GYN counsel {counseling: 16159}    F/U  No follow-ups on file.  Slade Pierpoint B. Janiah Devinney, PA-C 06/18/2024 3:29 PM

## 2024-06-20 ENCOUNTER — Encounter: Payer: Self-pay | Admitting: Obstetrics and Gynecology

## 2024-06-20 ENCOUNTER — Ambulatory Visit (INDEPENDENT_AMBULATORY_CARE_PROVIDER_SITE_OTHER): Payer: MEDICAID | Admitting: Obstetrics and Gynecology

## 2024-06-20 ENCOUNTER — Other Ambulatory Visit (HOSPITAL_COMMUNITY)
Admission: RE | Admit: 2024-06-20 | Discharge: 2024-06-20 | Disposition: A | Discharge status: Home / Self Care | Payer: MEDICAID | Source: Ambulatory Visit | Attending: Obstetrics and Gynecology | Admitting: Obstetrics and Gynecology

## 2024-06-20 VITALS — BP 69/43 | HR 86 | Wt 247.0 lb

## 2024-06-20 DIAGNOSIS — Z1151 Encounter for screening for human papillomavirus (HPV): Secondary | ICD-10-CM

## 2024-06-20 DIAGNOSIS — N951 Menopausal and female climacteric states: Secondary | ICD-10-CM

## 2024-06-20 DIAGNOSIS — Z1231 Encounter for screening mammogram for malignant neoplasm of breast: Secondary | ICD-10-CM

## 2024-06-20 DIAGNOSIS — Z01411 Encounter for gynecological examination (general) (routine) with abnormal findings: Secondary | ICD-10-CM | POA: Diagnosis not present

## 2024-06-20 DIAGNOSIS — B356 Tinea cruris: Secondary | ICD-10-CM

## 2024-06-20 DIAGNOSIS — B3731 Acute candidiasis of vulva and vagina: Secondary | ICD-10-CM | POA: Diagnosis not present

## 2024-06-20 DIAGNOSIS — Z124 Encounter for screening for malignant neoplasm of cervix: Secondary | ICD-10-CM | POA: Insufficient documentation

## 2024-06-20 DIAGNOSIS — Z01419 Encounter for gynecological examination (general) (routine) without abnormal findings: Secondary | ICD-10-CM

## 2024-06-20 DIAGNOSIS — Z3046 Encounter for surveillance of implantable subdermal contraceptive: Secondary | ICD-10-CM

## 2024-06-20 LAB — POCT WET PREP WITH KOH
Clue Cells Wet Prep HPF POC: NEGATIVE
KOH Prep POC: NEGATIVE
Trichomonas, UA: NEGATIVE
Yeast Wet Prep HPF POC: POSITIVE

## 2024-06-20 MED ORDER — KETOCONAZOLE 2 % EX CREA: 1.0000 | TOPICAL_CREAM | Freq: Every day | CUTANEOUS | 2 refills | Status: AC

## 2024-06-20 MED ORDER — PROGESTERONE MICRONIZED 100 MG PO CAPS
100.0000 mg | ORAL_CAPSULE | Freq: Every day | ORAL | 0 refills | Status: DC
Start: 2024-06-20 — End: 2024-07-18

## 2024-06-20 MED ORDER — FLUCONAZOLE 150 MG PO TABS: 150.0000 mg | ORAL_TABLET | Freq: Once | ORAL | 0 refills | Status: AC

## 2024-06-20 MED ORDER — ESTRADIOL 0.025 MG/24HR TD PTWK: 0.0250 mg | MEDICATED_PATCH | TRANSDERMAL | 3 refills | Status: AC

## 2024-06-20 NOTE — Patient Instructions (Signed)
 I value your feedback and you entrusting [place] with your care. If you get a [place] patient survey, I would appreciate you taking the time to let us know about your experience today. Thank you! ? ? ?

## 2024-06-22 ENCOUNTER — Emergency Department: Payer: MEDICAID

## 2024-06-22 ENCOUNTER — Inpatient Hospital Stay: Payer: MEDICAID

## 2024-06-22 ENCOUNTER — Inpatient Hospital Stay
Admission: EM | Admit: 2024-06-22 | Discharge: 2024-06-24 | DRG: 378 | Disposition: A | Discharge status: Home / Self Care | Payer: MEDICAID | Attending: Internal Medicine | Admitting: Internal Medicine

## 2024-06-22 ENCOUNTER — Other Ambulatory Visit: Payer: Self-pay

## 2024-06-22 DIAGNOSIS — Z794 Long term (current) use of insulin: Secondary | ICD-10-CM

## 2024-06-22 DIAGNOSIS — I5031 Acute diastolic (congestive) heart failure: Secondary | ICD-10-CM | POA: Diagnosis present

## 2024-06-22 DIAGNOSIS — E66813 Obesity, class 3: Secondary | ICD-10-CM | POA: Diagnosis present

## 2024-06-22 DIAGNOSIS — N3000 Acute cystitis without hematuria: Secondary | ICD-10-CM | POA: Diagnosis present

## 2024-06-22 DIAGNOSIS — E039 Hypothyroidism, unspecified: Secondary | ICD-10-CM | POA: Diagnosis present

## 2024-06-22 DIAGNOSIS — Z6841 Body Mass Index (BMI) 40.0 and over, adult: Secondary | ICD-10-CM | POA: Diagnosis not present

## 2024-06-22 DIAGNOSIS — E785 Hyperlipidemia, unspecified: Secondary | ICD-10-CM | POA: Diagnosis present

## 2024-06-22 DIAGNOSIS — I959 Hypotension, unspecified: Secondary | ICD-10-CM | POA: Diagnosis present

## 2024-06-22 DIAGNOSIS — K922 Gastrointestinal hemorrhage, unspecified: Secondary | ICD-10-CM | POA: Diagnosis not present

## 2024-06-22 DIAGNOSIS — E1129 Type 2 diabetes mellitus with other diabetic kidney complication: Secondary | ICD-10-CM | POA: Diagnosis present

## 2024-06-22 DIAGNOSIS — F32A Depression, unspecified: Secondary | ICD-10-CM | POA: Diagnosis present

## 2024-06-22 DIAGNOSIS — K219 Gastro-esophageal reflux disease without esophagitis: Secondary | ICD-10-CM | POA: Diagnosis present

## 2024-06-22 DIAGNOSIS — D649 Anemia, unspecified: Secondary | ICD-10-CM | POA: Diagnosis not present

## 2024-06-22 DIAGNOSIS — E1122 Type 2 diabetes mellitus with diabetic chronic kidney disease: Secondary | ICD-10-CM | POA: Diagnosis present

## 2024-06-22 DIAGNOSIS — Z7984 Long term (current) use of oral hypoglycemic drugs: Secondary | ICD-10-CM

## 2024-06-22 DIAGNOSIS — Z7951 Long term (current) use of inhaled steroids: Secondary | ICD-10-CM

## 2024-06-22 DIAGNOSIS — K2971 Gastritis, unspecified, with bleeding: Secondary | ICD-10-CM | POA: Diagnosis present

## 2024-06-22 DIAGNOSIS — N390 Urinary tract infection, site not specified: Secondary | ICD-10-CM | POA: Diagnosis present

## 2024-06-22 DIAGNOSIS — Z833 Family history of diabetes mellitus: Secondary | ICD-10-CM

## 2024-06-22 DIAGNOSIS — I130 Hypertensive heart and chronic kidney disease with heart failure and stage 1 through stage 4 chronic kidney disease, or unspecified chronic kidney disease: Secondary | ICD-10-CM | POA: Diagnosis present

## 2024-06-22 DIAGNOSIS — R600 Localized edema: Secondary | ICD-10-CM | POA: Diagnosis present

## 2024-06-22 DIAGNOSIS — Z79890 Hormone replacement therapy (postmenopausal): Secondary | ICD-10-CM

## 2024-06-22 DIAGNOSIS — F0394 Unspecified dementia, unspecified severity, with anxiety: Secondary | ICD-10-CM | POA: Diagnosis present

## 2024-06-22 DIAGNOSIS — F0393 Unspecified dementia, unspecified severity, with mood disturbance: Secondary | ICD-10-CM | POA: Diagnosis present

## 2024-06-22 DIAGNOSIS — B356 Tinea cruris: Secondary | ICD-10-CM | POA: Diagnosis present

## 2024-06-22 DIAGNOSIS — J45909 Unspecified asthma, uncomplicated: Secondary | ICD-10-CM | POA: Diagnosis present

## 2024-06-22 DIAGNOSIS — N951 Menopausal and female climacteric states: Secondary | ICD-10-CM | POA: Diagnosis not present

## 2024-06-22 DIAGNOSIS — N179 Acute kidney failure, unspecified: Secondary | ICD-10-CM | POA: Diagnosis present

## 2024-06-22 DIAGNOSIS — G4733 Obstructive sleep apnea (adult) (pediatric): Secondary | ICD-10-CM | POA: Diagnosis present

## 2024-06-22 DIAGNOSIS — F209 Schizophrenia, unspecified: Secondary | ICD-10-CM | POA: Diagnosis present

## 2024-06-22 DIAGNOSIS — Y92009 Unspecified place in unspecified non-institutional (private) residence as the place of occurrence of the external cause: Secondary | ICD-10-CM

## 2024-06-22 DIAGNOSIS — Z79899 Other long term (current) drug therapy: Secondary | ICD-10-CM

## 2024-06-22 DIAGNOSIS — B3731 Acute candidiasis of vulva and vagina: Secondary | ICD-10-CM | POA: Diagnosis present

## 2024-06-22 DIAGNOSIS — D62 Acute posthemorrhagic anemia: Secondary | ICD-10-CM | POA: Diagnosis present

## 2024-06-22 DIAGNOSIS — I10 Essential (primary) hypertension: Secondary | ICD-10-CM | POA: Diagnosis not present

## 2024-06-22 DIAGNOSIS — F17220 Nicotine dependence, chewing tobacco, uncomplicated: Secondary | ICD-10-CM | POA: Diagnosis present

## 2024-06-22 DIAGNOSIS — F418 Other specified anxiety disorders: Secondary | ICD-10-CM | POA: Diagnosis not present

## 2024-06-22 DIAGNOSIS — N184 Chronic kidney disease, stage 4 (severe): Secondary | ICD-10-CM | POA: Diagnosis present

## 2024-06-22 DIAGNOSIS — Z8249 Family history of ischemic heart disease and other diseases of the circulatory system: Secondary | ICD-10-CM

## 2024-06-22 DIAGNOSIS — Z7982 Long term (current) use of aspirin: Secondary | ICD-10-CM

## 2024-06-22 DIAGNOSIS — Z87440 Personal history of urinary (tract) infections: Secondary | ICD-10-CM

## 2024-06-22 DIAGNOSIS — Z791 Long term (current) use of non-steroidal anti-inflammatories (NSAID): Secondary | ICD-10-CM

## 2024-06-22 HISTORY — DX: Sleep apnea, unspecified: G47.30

## 2024-06-22 HISTORY — DX: Hyperlipidemia, unspecified: E78.5

## 2024-06-22 HISTORY — DX: Hypothyroidism, unspecified: E03.9

## 2024-06-22 HISTORY — DX: Unspecified asthma, uncomplicated: J45.909

## 2024-06-22 HISTORY — DX: Schizophrenia, unspecified: F20.9

## 2024-06-22 LAB — COMPREHENSIVE METABOLIC PANEL WITH GFR
ALT: 21 U/L (ref 0–44)
AST: 46 U/L — ABNORMAL HIGH (ref 15–41)
Albumin: 3.6 g/dL (ref 3.5–5.0)
Alkaline Phosphatase: 42 U/L (ref 38–126)
Anion gap: 10 (ref 5–15)
BUN: 46 mg/dL — ABNORMAL HIGH (ref 6–20)
CO2: 24 mmol/L (ref 22–32)
Calcium: 9.5 mg/dL (ref 8.9–10.3)
Chloride: 103 mmol/L (ref 98–111)
Creatinine, Ser: 2.92 mg/dL — ABNORMAL HIGH (ref 0.44–1.00)
GFR, Estimated: 19 mL/min — ABNORMAL LOW (ref >60)
Glucose, Bld: 166 mg/dL — ABNORMAL HIGH (ref 70–99)
Potassium: 4.7 mmol/L (ref 3.5–5.1)
Sodium: 137 mmol/L (ref 135–145)
Total Bilirubin: 0.9 mg/dL (ref 0.0–1.2)
Total Protein: 7.3 g/dL (ref 6.5–8.1)

## 2024-06-22 LAB — CBC WITH DIFFERENTIAL/PLATELET
Abs Immature Granulocytes: 0.58 10*3/uL — ABNORMAL HIGH (ref 0.00–0.07)
Basophils Absolute: 0.1 10*3/uL (ref 0.0–0.1)
Basophils Relative: 1 %
Eosinophils Absolute: 0.3 10*3/uL (ref 0.0–0.5)
Eosinophils Relative: 3 %
HCT: 26.2 % — ABNORMAL LOW (ref 36.0–46.0)
Hemoglobin: 8.6 g/dL — ABNORMAL LOW (ref 12.0–15.0)
Immature Granulocytes: 7 %
Lymphocytes Relative: 38 %
Lymphs Abs: 3.3 10*3/uL (ref 0.7–4.0)
MCH: 31.6 pg (ref 26.0–34.0)
MCHC: 32.8 g/dL (ref 30.0–36.0)
MCV: 96.3 fL (ref 80.0–100.0)
Monocytes Absolute: 1 10*3/uL (ref 0.1–1.0)
Monocytes Relative: 11 %
Neutro Abs: 3.5 10*3/uL (ref 1.7–7.7)
Neutrophils Relative %: 40 %
Platelets: 188 10*3/uL (ref 150–400)
RBC: 2.72 MIL/uL — ABNORMAL LOW (ref 3.87–5.11)
RDW: 11.9 % (ref 11.5–15.5)
Smear Review: NORMAL
WBC: 8.7 10*3/uL (ref 4.0–10.5)
nRBC: 0.2 % (ref 0.0–0.2)

## 2024-06-22 LAB — TROPONIN I (HIGH SENSITIVITY)
Troponin I (High Sensitivity): 5 ng/L (ref <18)
Troponin I (High Sensitivity): 5 ng/L (ref <18)

## 2024-06-22 LAB — URINALYSIS, ROUTINE W REFLEX MICROSCOPIC
Bilirubin Urine: NEGATIVE
Glucose, UA: >=500 mg/dL — AB
Hgb urine dipstick: NEGATIVE
Ketones, ur: NEGATIVE mg/dL
Nitrite: NEGATIVE
Protein, ur: NEGATIVE mg/dL
Specific Gravity, Urine: 1.016 (ref 1.005–1.030)
pH: 5 (ref 5.0–8.0)

## 2024-06-22 LAB — VALPROIC ACID LEVEL: Valproic Acid Lvl: 74 ug/mL (ref 50–100)

## 2024-06-22 LAB — GLUCOSE, CAPILLARY: Glucose-Capillary: 167 mg/dL — ABNORMAL HIGH (ref 70–99)

## 2024-06-22 LAB — LACTIC ACID, PLASMA: Lactic Acid, Venous: 0.9 mmol/L (ref 0.5–1.9)

## 2024-06-22 MED ORDER — ONDANSETRON HCL 4 MG/2ML IJ SOLN
4.0000 mg | Freq: Once | INTRAMUSCULAR | Status: AC
  Administered 2024-06-22: 4 mg via INTRAVENOUS
  Filled 2024-06-22: qty 2

## 2024-06-22 MED ORDER — SODIUM CHLORIDE 0.9 % IV BOLUS
500.0000 mL | Freq: Once | INTRAVENOUS | Status: AC
  Administered 2024-06-22: 500 mL via INTRAVENOUS

## 2024-06-22 MED ORDER — ACETAMINOPHEN 325 MG PO TABS: 650.0000 mg | ORAL_TABLET | Freq: Four times a day (QID) | ORAL | Status: DC | PRN

## 2024-06-22 MED ORDER — ORAL CARE MOUTH RINSE
15.0000 mL | OROMUCOSAL | Status: DC | PRN
Start: 2024-06-22 — End: 2024-06-24

## 2024-06-22 MED ORDER — DM-GUAIFENESIN ER 30-600 MG PO TB12: 1.0000 | ORAL_TABLET | Freq: Two times a day (BID) | ORAL | Status: DC | PRN

## 2024-06-22 MED ORDER — ONDANSETRON HCL 4 MG/2ML IJ SOLN: 4.0000 mg | Freq: Three times a day (TID) | INTRAMUSCULAR | Status: DC | PRN

## 2024-06-22 MED ORDER — SODIUM CHLORIDE 0.9 % IV SOLN
2.0000 g | INTRAVENOUS | Status: DC
  Administered 2024-06-22 – 2024-06-23 (×2): 2 g via INTRAVENOUS
  Filled 2024-06-22 (×2): qty 20

## 2024-06-22 MED ORDER — INSULIN ASPART 100 UNIT/ML IJ SOLN: 0.0000 [IU] | Freq: Every day | INTRAMUSCULAR | Status: DC

## 2024-06-22 MED ORDER — HYDRALAZINE HCL 20 MG/ML IJ SOLN: 5.0000 mg | INTRAMUSCULAR | Status: DC | PRN

## 2024-06-22 MED ORDER — PANTOPRAZOLE SODIUM 40 MG IV SOLR
40.0000 mg | Freq: Two times a day (BID) | INTRAVENOUS | Status: DC
  Administered 2024-06-22 – 2024-06-24 (×4): 40 mg via INTRAVENOUS
  Filled 2024-06-22 (×4): qty 10

## 2024-06-22 MED ORDER — ALBUTEROL SULFATE (2.5 MG/3ML) 0.083% IN NEBU: 3.0000 mL | INHALATION_SOLUTION | RESPIRATORY_TRACT | Status: DC | PRN

## 2024-06-22 MED ORDER — INSULIN ASPART 100 UNIT/ML IJ SOLN
0.0000 [IU] | Freq: Three times a day (TID) | INTRAMUSCULAR | Status: DC
  Administered 2024-06-23: 1 [IU] via SUBCUTANEOUS
  Filled 2024-06-22: qty 1

## 2024-06-22 MED ORDER — OXYCODONE-ACETAMINOPHEN 5-325 MG PO TABS: 1.0000 | ORAL_TABLET | ORAL | Status: DC | PRN

## 2024-06-22 NOTE — ED Provider Notes (Signed)
 [facility] Provider Note    Event Date/Time   First MD Initiated Contact with Patient 06/22/24 1738     (approximate)   History   Dizziness and Hypotension   HPI  Mackenzie Randall is a 53 y.o. female with a history of diabetes, cognitive impairment, schizophrenia who comes in with concerns for dizziness, hypotension.  Patient's last known normal was around 1 PM.  She reports that she then developed feeling woozy with some nausea and vomiting.  EMS report of the house was very warm and they wondered if she could have had heatstroke.  She continues to state that there is medication she was started on that she feels like could be making her feel woozy.  She did fall and hit her head a week ago.  She does report some abdominal pain as well.  [phone] [place] she reports that patient did get a course of Macrobid recently.  She is also recently started on ketoconazole , progesterone  and fluconazole .  Reviewed a note from OB/GYN where this was secondary to some vaginal itching.  Patient did have a low blood pressure with EMS and was given some fluids  Physical Exam   Triage Vital Signs: ED Triage Vitals [06/22/24 1736]  Encounter Vitals Group     BP      Girls Systolic BP Percentile      Girls Diastolic BP Percentile      Boys Systolic BP Percentile      Boys Diastolic BP Percentile      Pulse      Resp      Temp      Temp src      SpO2 97 %     Weight      Height      Head Circumference      Peak Flow      Pain Score      Pain Loc      Pain Education      Exclude from Growth Chart     Most recent vital signs: Vitals:   06/22/24 1736  SpO2: 97%     General: Awake, no distress.  CV:  Good peripheral perfusion.  Resp:  Normal effort.  Abd:  No distention.  Slightly tender in the abdomen. Other:  Cranial nerves appear intact equal strength in arms and legs.  Finger-nose intact bilaterally   ED Results / Procedures / Treatments    Labs (all labs ordered are listed, but only abnormal results are displayed) Labs Reviewed  CBC WITH DIFFERENTIAL/PLATELET  COMPREHENSIVE METABOLIC PANEL WITH GFR  LACTIC ACID, PLASMA  LACTIC ACID, PLASMA  URINALYSIS, ROUTINE W REFLEX MICROSCOPIC  TROPONIN I (HIGH SENSITIVITY)     EKG  My interpretation of EKG:  Sinus rate of 76 without any ST elevation or T wave inversions, normal intervals  RADIOLOGY I have reviewed the ct personally and interpreted no intracranial hemorrhage   PROCEDURES:  Critical Care performed: No  Procedures   MEDICATIONS ORDERED IN ED: Medications  sodium chloride  0.9 % bolus 500 mL (has no administration in time range)  ondansetron  (ZOFRAN ) injection 4 mg (has no administration in time range)     IMPRESSION / MDM / ASSESSMENT AND PLAN / ED COURSE  I reviewed the triage vital signs and the nursing notes.   Patient's presentation is most consistent with acute presentation with potential threat to life or bodily function.   Patient comes in with concerns for hypotension, dizziness.  Patient out  of the window for stroke code with symptoms going on greater than 4-1/2 hours.  Doubt LVO considered CT angio to evaluate for vasculature but patient has known CKD and my suspicion is overall low enough that I think the risk from contrast would outweigh the possibility of finding LVO.  LVO would not explain her having the abdominal pain, low blood pressure and so I suspect that her symptoms are more likely related to this, versus the new medications that patient was recently started on.  Therefore we will proceed with IV fluids, Zofran , blood work and CT scans without contrast to further evaluate.  CBC shows low hemoglobin of 8.6.  I reviewed her hemoglobin from 7/17 that was 11.2   CT head and neck are negative.  CT abdomen negative  Unclear what is causing the drop in hemoglobin or if this is contributing to the hypotensive in.  She does have  worsening CKD with baseline creatinine of 2.2 now up to 2.9.  I do not really have any priors before then to compare to but this was just recently done on 7/17.  Could be from the Macrobid that patient was recently prescribed.  She is reporting some increased urinary frequency and urine does have 11-20 WBCs with trace leuks so we will send for culture and start patient on ceftriaxone .  Will discuss to the hospital team for admission given patient's continued weakness and the above for further workup and evaluation.  I did do a rectal exam that was brown stool but was Hemoccult positive  Patient is afebrile, normal white count doubt severe infection such as sepsis so we will hold off on blood cultures.  The patient is on the cardiac monitor to evaluate for evidence of arrhythmia and/or significant heart rate changes.      FINAL CLINICAL IMPRESSION(S) / ED DIAGNOSES   Final diagnoses:  AKI (acute kidney injury) (HCC)  Anemia, unspecified type  Hypotension, unspecified hypotension type  Acute cystitis without hematuria     Rx / DC Orders   ED Discharge Orders     None        Note:  This document was prepared using Dragon voice recognition software and may include unintentional dictation errors.   Ernest Ronal BRAVO, MD 06/22/24 KENITH

## 2024-06-22 NOTE — ED Triage Notes (Addendum)
 Per EMS, Pt, from [facility], c/o dizziness and hypotension starting this afternoon and abdominal pain starting a few minutes ago.  Denies n/v/d.  EMS reported the house is very warm.    Pt reports she was started on a new medication x2 days ago.  Sts that is why I feel woozy.

## 2024-06-22 NOTE — ED Notes (Signed)
 Pt was able to walk to toilet and back w/ minimal assistance.

## 2024-06-22 NOTE — ED Notes (Signed)
 Pt was given snack tray and drink

## 2024-06-22 NOTE — H&P (Signed)
 History and Physical    Mackenzie Randall FMW:[id number] DOB: 10-05-1971 DOA: 06/22/2024  Referring MD/NP/PA:   PCP: [facility]   Patient coming from:  The patient is coming from group home.     Chief Complaint: dizziness, dark stool   HPI: Mackenzie Randall is a 53 y.o. female with medical history significant of schizophrenia, depression with anxiety, HTN, HLD, DM, astham, bilateral leg edema (possible CHF?,  on Lasix, no 2D echo on record), GERD, mild dementia, CKD-IV, morbid obesity, OSA on CPAP, anemia, who presents with dizziness, dark stool.  Per report, pt was last known normal around 1 PM.  He started feeling woozy and dizzy in the afternoon.  She did not fall today, but states that she fell 1 week ago.  No L LOC.  She hit her head and injured her left knee.  She still has some pain in left knee.  She states that she had 1 episode of dark stool today. She had nausea and vomited once earlier, denies abdominal pain to me.  No diarrhea.  No fever or chills.  Denies chest pain, cough, SOB.  She was recently treated with Macrobid for UTI.  Today she denies symptoms of UTI.  No fever or chills. Per EMS report, patient had low blood pressure.  Her blood pressure is 91/53 in ED which improved to 110/59 after giving 500 cc normal saline x 2 dose.  Of note, pt was seen by OB/GYN for follow-up visit due to perimenopausal vasomotor symptoms on 06/20/24. Pt was started on progesterone  capsule and estradiol  patch.  Patient was given 1 dose of Diflucan  for candidal vaginitis.  Patient was also started on fluconazole  for tinea cruris.  Today patient denies vaginal discharge and vaginal itchiness.  No vaginal bleeding.  Data reviewed independently and ED Course: pt was found to have WBC 8.7, hemoglobin 8.6 (11.2 recently), troponin 5 --> 5, lactic acid 0.9, UA (hazy appearance, trace amount of leukocyte, rare bacteria, WBC 11-20), worsening renal function, temperature normal, blood pressure  91/53, which improved to 110/59 after giving 500 cc normal saline x 2 dose, heart rate 78, RR 18, oxygen saturation 100% on room air.  CT of head and CT of the C-spine negative for acute injury.  CT of abdomen/pelvis negative for acute intra-abdominal issues.  Patient is admitted to telemetry bed as inpatient.  Epic message is scheduled to be sent to Dr. Onita of GI for consult at 6 AM   EKG: I have personally reviewed.  Sinus rhythm, QTc 443, poor R wave progression, nonspecific T wave change.   Review of Systems:   General: no fevers, chills, no body weight gain, has fatigue HEENT: no blurry vision, hearing changes or sore throat Respiratory: no dyspnea, coughing, wheezing CV: no chest pain, no palpitations GI: has nausea, vomiting, dark stool, no abdominal pain, diarrhea, constipation GU: no dysuria, burning on urination, increased urinary frequency, hematuria  Ext: has leg edema Neuro: no unilateral weakness, numbness, or tingling, no vision change or hearing loss. has dizziness and fall Skin: no rash, no skin tear. MSK: No muscle spasm, no deformity, no limitation of range of movement in spin. Has left knee pain Heme: No easy bruising.  Travel history: No recent long distant travel.   Allergy: No Known Allergies  Past Medical History:  Diagnosis Date   Asthma    Diabetes mellitus without complication (HCC)    Hyperlipemia    Hypertension    Hypothyroidism    Schizophrenia (  HCC)    Sleep apnea     Past Surgical History:  Procedure Laterality Date   Denies surgical history      Social History:  reports that she has never smoked. Her smokeless tobacco use includes chew and snuff. She reports that she does not currently use alcohol. She reports that she does not use drugs.  Family History:  Family History  Problem Relation Age of Onset   Diabetes Mother    Hypertension Mother    Sleep apnea Father    Heart attack Father    Gout Maternal Grandfather    Breast cancer  Neg Hx      Prior to Admission medications   Medication Sig Start Date End Date Taking? Authorizing Provider  ACCU-CHEK GUIDE TEST test strip  05/27/24   [provider]  Accu-Chek Softclix Lancets lancets SMARTSIG:Topical 04/05/24   [provider]  albuterol  (VENTOLIN  HFA) 108 (90 Base) MCG/ACT inhaler Inhale 2 puffs into the lungs every 6 (six) hours as needed.    [provider]  amLODipine (NORVASC) 10 MG tablet Take 10 mg by mouth.    [provider]  aspirin EC 81 MG tablet Take 81 mg by mouth.    [provider]  buPROPion  (WELLBUTRIN  XL) 300 MG 24 hr tablet Take 300 mg by mouth.    [provider]  carvedilol (COREG) 25 MG tablet Take 25 mg by mouth daily.    [provider]  cetirizine (ZYRTEC) 10 MG tablet Take 10 mg by mouth daily. 03/07/24   [provider]  clobetasol  ointment (TEMOVATE ) 0.05 % Apply once a day for 4 weeks, then every other day for 4 weeks, then once a week as needed. 09/08/20   Schuman, Christanna R, MD  cloNIDine (CATAPRES) 0.1 MG tablet Take 0.1 mg by mouth daily.    [provider]  clotrimazole  (LOTRIMIN ) 1 % cream Apply topically 2 (two) times daily.    [provider]  colchicine  0.6 MG tablet Take 0.6 mg by mouth. 08/05/22   [provider]  desvenlafaxine (PRISTIQ) 100 MG 24 hr tablet Take 100 mg by mouth daily. 03/07/24   [provider]  divalproex  (DEPAKOTE  ER) 500 MG 24 hr tablet Take 500 mg by mouth 3 (three) times daily. 03/07/24   [provider]  estradiol  (CLIMARA  - DOSED IN MG/24 HR) 0.025 mg/24hr patch Place 1 patch (0.025 mg total) onto the skin once a week. 06/20/24   Copland, Alicia B, PA-C  etonogestrel  (NEXPLANON ) 68 MG IMPL implant 1 each by Subdermal route once.    [provider]  FARXIGA 10 MG TABS tablet Take 10 mg by mouth daily. 05/20/24   [provider]  ferrous sulfate  324 (65 Fe) MG TBEC Take 324 mg by  mouth.    [provider]  fluticasone (FLONASE) 50 MCG/ACT nasal spray Place into both nostrils. 02/23/24   [provider]  fluticasone-salmeterol (ADVAIR) 250-50 MCG/ACT AEPB Inhale 1 puff into the lungs.    [provider]  furosemide (LASIX) 20 MG tablet Take 20 mg by mouth daily.    [provider]  gabapentin (NEURONTIN) 100 MG capsule Take by mouth. 03/04/24   [provider]  hydrochlorothiazide (HYDRODIURIL) 25 MG tablet Take 25 mg by mouth daily.    [provider]  JARDIANCE 25 MG TABS tablet Take 25 mg by mouth daily.    [provider]  KERENDIA  20 MG TABS Take by mouth. 03/25/24  [provider]  ketoconazole  (NIZORAL ) 2 % cream Apply 1 Application topically daily. For 4 wks 06/20/24   Copland, Alicia B, PA-C  KLOR-CON M20 20 MEQ tablet Take by mouth. 03/07/24   [provider]  LANTUS  SOLOSTAR 100 UNIT/ML Solostar Pen Inject into the skin. 03/12/24   [provider]  levothyroxine  (SYNTHROID ) 100 MCG tablet Take 100 mcg by mouth daily. 03/07/24   [provider]  lisinopril (ZESTRIL) 40 MG tablet Take 40 mg by mouth daily.    [provider]  meloxicam (MOBIC) 15 MG tablet Take 15 mg by mouth daily.    [provider]  methylphenidate  (RITALIN ) 10 MG tablet Take 10 mg by mouth daily. 03/21/24   [provider]  metolazone (ZAROXOLYN) 2.5 MG tablet Take 2.5 mg by mouth daily. 03/07/24   [provider]  nitrofurantoin, macrocrystal-monohydrate, (MACROBID) 100 MG capsule Take 100 mg by mouth 2 (two) times daily. 06/17/24   [provider]  nystatin  (MYCOSTATIN /NYSTOP ) powder Apply 1 application topically 3 (three) times daily. 05/05/20   Eldonna Suzen Octave, MD  omeprazole (PRILOSEC) 20 MG capsule Take 20 mg by mouth daily. 03/07/24   [provider]  pantoprazole  (PROTONIX ) 20 MG tablet Take 20 mg by mouth. 11/01/23 10/31/24  [provider]  pravastatin (PRAVACHOL) 40 MG tablet Take 40 mg by mouth daily.    [provider]  progesterone  (PROMETRIUM ) 100 MG capsule Take 1 capsule (100 mg total) by mouth daily. 06/20/24   Copland, Alicia B, PA-C  QUEtiapine  (SEROQUEL ) 50 MG tablet Take by mouth. 03/07/24   [provider]  rosuvastatin  (CRESTOR ) 40 MG tablet Take 40 mg by mouth daily. 06/07/24   [provider]  sitaGLIPtin (JANUVIA) 100 MG tablet Take 100 mg by mouth daily.    [provider]  OTELIA DECANT 2.5-2.5 MCG/ACT AERS SMARTSIG:2 Puff(s) Via Inhaler Daily 02/23/24   [provider]  SURE COMFORT PEN NEEDLES 31G X 5 MM MISC  05/13/24   [provider]  traZODone (DESYREL) 100 MG tablet Take 100 mg by mouth at bedtime. 03/07/24   [provider]  TRULICITY 1.5 MG/0.5ML SOAJ Inject into the skin.    [provider]  valproic  acid (DEPAKENE ) 250 MG/5ML solution Take by mouth.    [provider]  ziprasidone  (GEODON ) 80 MG capsule Take 80 mg by mouth 2 (two) times daily. 03/07/24   [provider]    Physical Exam: Vitals:   06/22/24 1840 06/22/24 1905 06/22/24 2100 06/22/24 2217  BP: 102/65 (!) 110/59 115/76 (!) 118/57  Pulse: 72 74 69 72  Resp: 17 18 (!) 21   Temp:   97.8 F (36.6 C) 98.1 F (36.7 C)  TempSrc:   Oral   SpO2: 99% 100% 100% 100%  Weight:       General: Not in acute distress HEENT:       Eyes: PERRL, EOMI, no jaundice       ENT: No discharge from the ears and nose, no pharynx injection, no tonsillar enlargement.        Neck: No JVD, no bruit, no mass felt. Heme: No neck lymph node enlargement. Cardiac: S1/S2, RRR, No murmurs, No gallops or rubs. Respiratory: No rales, wheezing, rhonchi or rubs. GI: Soft, nondistended, nontender, no rebound pain, no organomegaly, BS present. GU: No hematuria Ext: has 1+ pitting leg edema bilaterally. 1+DP/PT pulse bilaterally. Musculoskeletal: No joint deformities,  No joint redness or warmth, no limitation of ROM  in spin. Has left knee tenderness without swelling or erythema Skin: No rashes.  Neuro: Alert, oriented X3, cranial nerves II-XII grossly intact, moves all extremities normally. Psych: Patient is not psychotic, no suicidal or hemocidal ideation.  Labs on Admission: I have personally reviewed following labs and imaging studies  CBC: Recent Labs  Lab 06/22/24 1745  WBC 8.7  NEUTROABS 3.5  HGB 8.6*  HCT 26.2*  MCV 96.3  PLT 188   Basic Metabolic Panel: Recent Labs  Lab 06/22/24 1745  NA 137  K 4.7  CL 103  CO2 24  GLUCOSE 166*  BUN 46*  CREATININE 2.92*  CALCIUM  9.5   GFR: Estimated Creatinine Clearance: 27.8 mL/min (A) (by C-G formula based on SCr of 2.92 mg/dL (H)). Liver Function Tests: Recent Labs  Lab 06/22/24 1745  AST 46*  ALT 21  ALKPHOS 42  BILITOT 0.9  PROT 7.3  ALBUMIN 3.6   No results for input(s): LIPASE, AMYLASE in the last 168 hours. No results for input(s): AMMONIA in the last 168 hours. Coagulation Profile: No results for input(s): INR, PROTIME in the last 168 hours. Cardiac Enzymes: No results for input(s): CKTOTAL, CKMB, CKMBINDEX, TROPONINI in the last 168 hours. BNP (last 3 results) No results for input(s): PROBNP in the last 8760 hours. HbA1C: No results for input(s): HGBA1C in the last 72 hours. CBG: Recent Labs  Lab 06/22/24 2216  GLUCAP 167*   Lipid Profile: No results for input(s): CHOL, HDL, LDLCALC, TRIG, CHOLHDL, LDLDIRECT in the last 72 hours. Thyroid  Function Tests: No results for input(s): TSH, T4TOTAL, FREET4, T3FREE, THYROIDAB in the last 72 hours. Anemia Panel: No results for input(s): VITAMINB12, FOLATE, FERRITIN, TIBC, IRON, RETICCTPCT in the last 72 hours. Urine analysis:    Component Value Date/Time   COLORURINE YELLOW (A) 06/22/2024 1907   APPEARANCEUR HAZY (A) 06/22/2024 1907   LABSPEC 1.016 06/22/2024  1907   PHURINE 5.0 06/22/2024 1907   GLUCOSEU >=500 (A) 06/22/2024 1907   HGBUR NEGATIVE 06/22/2024 1907   BILIRUBINUR NEGATIVE 06/22/2024 1907   KETONESUR NEGATIVE 06/22/2024 1907   PROTEINUR NEGATIVE 06/22/2024 1907   NITRITE NEGATIVE 06/22/2024 1907   LEUKOCYTESUR TRACE (A) 06/22/2024 1907   Sepsis Labs: @LABRCNTIP (procalcitonin:4,lacticidven:4) )No results found for this or any previous visit (from the past 240 hours).   Radiological Exams on Admission:   Assessment/Plan Principal Problem:   GI bleeding Active Problems:   Acute blood loss anemia   UTI (urinary tract infection)   Bilateral leg edema   Hypertension   HLD (hyperlipidemia)   CKD (chronic kidney disease) stage 4, GFR 15-29 ml/min (HCC)   Type II diabetes mellitus with renal manifestations (HCC)   GERD (gastroesophageal reflux disease)   Asthma   Fall at home, initial encounter   Schizophrenia (HCC)   Tinea cruris   Depression with anxiety   Perimenopausal vasomotor symptoms   Obesity, Class III, BMI 40-49.9 (morbid obesity)   Assessment and Plan:   GI bleeding and acute blood loss anemia:    Epic message is scheduled to be sent to Dr. Onita of GI for consult at 6 AM  Active Problems:      UTI (urinary tract infection)   Bilateral leg edema   Hypertension   HLD (hyperlipidemia)   CKD (chronic kidney disease) stage 4, GFR 15-29 ml/min (HCC)   Type II diabetes mellitus with renal manifestations (HCC)   GERD (gastroesophageal reflux disease)   Asthma   Fall at home, initial encounter   Schizophrenia (HCC)  Depression with anxiety     Obesity, Class III, BMI 40-49.9 (morbid obesity): Patient has Obesity Class III, with body weight  Kg and BMI  kg/m2.  - Encourage losing weight - Exercise and healthy diet    Tinea cruris: Tinea cruris - Plan: fluconazole  (DIFLUCAN ) 150 MG tablet, ketoconazole  (NIZORAL ) 2 % cream, POCT Wet Prep with KOH; Rx diflucan  for 1 dose only due to daily colchicine   use and contraindications; Rx ketoconazole  crm every day for 4 wks. F/u in 4 wks at nexplanon  rem appt.    Perimenopausal vasomotor symptoms: per Ob/Gyn PA, Alicia Copland's note on 06/20/24. Plan: progesterone  (PROMETRIUM ) 100 MG capsule, estradiol  (CLIMARA  - DOSED IN MG/24 HR) 0.025 mg/24hr patch; Rx RF climara , add prometrium  due to expired nexplanon . Rxs eRxd. F/u at nexplanon  rem.        Ob/Gyn PA<  Alicia Copland on 06/20/24 :  Perimenopausal vasomotor symptoms - Plan: progesterone  (PROMETRIUM ) 100 MG capsule, estradiol  (CLIMARA  - DOSED IN MG/24 HR) 0.025 mg/24hr patch; Rx RF climara , add prometrium  due to expired nexplanon . Rxs eRxd. F/u at nexplanon  rem.   Tinea cruris - Plan: fluconazole  (DIFLUCAN ) 150 MG tablet, ketoconazole  (NIZORAL ) 2 % cream, POCT Wet Prep with KOH; Rx diflucan  for 1 dose only due to daily colchicine  use and contraindications; Rx ketoconazole  crm every day for 4 wks. F/u in 4 wks at nexplanon  rem appt.    Candidal vaginitis - Plan: POCT Wet Prep with KOH; pos sx and wet prep, Rx diflucan .              DVT ppx: SCD  Code Status: Full code   Family Communication:     not done, no family member is at bed side.    Disposition Plan:  Anticipate discharge back to previous environment, group home  Consults called: Epic message is scheduled to be sent to Dr. Onita of GI for consult at 6 AM  Admission status and Level of care: Telemetry Medical: as inpt        Dispo: The patient is from: Group home              Anticipated d/c is to: Group home              Anticipated d/c date is: 2 days              Patient currently is not medically stable to d/c.    Severity of Illness:  The appropriate patient status for this patient is INPATIENT. Inpatient status is judged to be reasonable and necessary in order to provide the required intensity of service to ensure the patient's safety. The patient's presenting symptoms, physical exam findings, and  initial radiographic and laboratory data in the context of their chronic comorbidities is felt to place them at high risk for further clinical deterioration. Furthermore, it is not anticipated that the patient will be medically stable for discharge from the hospital within 2 midnights of admission.   * I certify that at the point of admission it is my clinical judgment that the patient will require inpatient hospital care spanning beyond 2 midnights from the point of admission due to high intensity of service, high risk for further deterioration and high frequency of surveillance required.*       Date of Service 06/22/2024    Caleb Exon [facility]   If 7PM-7AM, please contact night-coverage [email or web address] 06/22/2024, 10:42 PM

## 2024-06-22 NOTE — ED Notes (Signed)
 Patient transported to CT

## 2024-06-22 NOTE — ED Notes (Signed)
 Pt reports she is able to tell us  when she needs to urinate but currently cannot provide a sample.

## 2024-06-23 ENCOUNTER — Encounter: Payer: Self-pay | Admitting: Internal Medicine

## 2024-06-23 ENCOUNTER — Inpatient Hospital Stay: Payer: MEDICAID | Admitting: Anesthesiology

## 2024-06-23 ENCOUNTER — Encounter
Admission: EM | Disposition: A | Discharge status: Home / Self Care | Payer: Self-pay | Source: Home / Self Care | Attending: Internal Medicine

## 2024-06-23 DIAGNOSIS — K922 Gastrointestinal hemorrhage, unspecified: Secondary | ICD-10-CM | POA: Diagnosis not present

## 2024-06-23 HISTORY — PX: ESOPHAGOGASTRODUODENOSCOPY: SHX5428

## 2024-06-23 LAB — CBC
HCT: 26.8 % — ABNORMAL LOW (ref 36.0–46.0)
HCT: 28.4 % — ABNORMAL LOW (ref 36.0–46.0)
Hemoglobin: 8.8 g/dL — ABNORMAL LOW (ref 12.0–15.0)
Hemoglobin: 9.2 g/dL — ABNORMAL LOW (ref 12.0–15.0)
MCH: 31.2 pg (ref 26.0–34.0)
MCH: 31.3 pg (ref 26.0–34.0)
MCHC: 32.4 g/dL (ref 30.0–36.0)
MCHC: 32.8 g/dL (ref 30.0–36.0)
MCV: 95.4 fL (ref 80.0–100.0)
MCV: 96.3 fL (ref 80.0–100.0)
Platelets: 183 K/uL (ref 150–400)
Platelets: 201 K/uL (ref 150–400)
RBC: 2.81 MIL/uL — ABNORMAL LOW (ref 3.87–5.11)
RBC: 2.95 MIL/uL — ABNORMAL LOW (ref 3.87–5.11)
RDW: 11.7 % (ref 11.5–15.5)
RDW: 11.8 % (ref 11.5–15.5)
WBC: 8.1 K/uL (ref 4.0–10.5)
WBC: 8.3 K/uL (ref 4.0–10.5)
nRBC: 0 % (ref 0.0–0.2)
nRBC: 0.2 % (ref 0.0–0.2)

## 2024-06-23 LAB — RETICULOCYTES
Immature Retic Fract: 36.1 % — ABNORMAL HIGH (ref 2.3–15.9)
RBC.: 2.97 MIL/uL — ABNORMAL LOW (ref 3.87–5.11)
Retic Count, Absolute: 55.8 K/uL (ref 19.0–186.0)
Retic Ct Pct: 1.9 % (ref 0.4–3.1)

## 2024-06-23 LAB — PROTIME-INR
INR: 1 (ref 0.8–1.2)
Prothrombin Time: 14.1 s (ref 11.4–15.2)

## 2024-06-23 LAB — BASIC METABOLIC PANEL WITH GFR
Anion gap: 12 (ref 5–15)
BUN: 34 mg/dL — ABNORMAL HIGH (ref 6–20)
CO2: 25 mmol/L (ref 22–32)
Calcium: 9.5 mg/dL (ref 8.9–10.3)
Chloride: 102 mmol/L (ref 98–111)
Creatinine, Ser: 2.23 mg/dL — ABNORMAL HIGH (ref 0.44–1.00)
GFR, Estimated: 26 mL/min — ABNORMAL LOW (ref >60)
Glucose, Bld: 124 mg/dL — ABNORMAL HIGH (ref 70–99)
Potassium: 4.4 mmol/L (ref 3.5–5.1)
Sodium: 139 mmol/L (ref 135–145)

## 2024-06-23 LAB — CK: Total CK: 1136 U/L — ABNORMAL HIGH (ref 38–234)

## 2024-06-23 LAB — APTT: aPTT: <22 s — ABNORMAL LOW (ref 24–36)

## 2024-06-23 LAB — IRON AND TIBC
Iron: 76 ug/dL (ref 28–170)
Saturation Ratios: 19 % (ref 10.4–31.8)
TIBC: 403 ug/dL (ref 250–450)
UIBC: 327 ug/dL

## 2024-06-23 LAB — GLUCOSE, CAPILLARY
Glucose-Capillary: 128 mg/dL — ABNORMAL HIGH (ref 70–99)
Glucose-Capillary: 110 mg/dL — ABNORMAL HIGH (ref 70–99)
Glucose-Capillary: 84 mg/dL (ref 70–99)
Glucose-Capillary: 140 mg/dL — ABNORMAL HIGH (ref 70–99)

## 2024-06-23 LAB — VITAMIN B12: Vitamin B-12: 4285 pg/mL — ABNORMAL HIGH (ref 180–914)

## 2024-06-23 LAB — BRAIN NATRIURETIC PEPTIDE: B Natriuretic Peptide: 20 pg/mL (ref 0.0–100.0)

## 2024-06-23 LAB — FERRITIN: Ferritin: 1541 ng/mL — ABNORMAL HIGH (ref 11–307)

## 2024-06-23 LAB — HIV ANTIBODY (ROUTINE TESTING W REFLEX): HIV Screen 4th Generation wRfx: NONREACTIVE

## 2024-06-23 LAB — FOLATE: Folate: 12.5 ng/mL (ref >5.9)

## 2024-06-23 LAB — TYPE AND SCREEN
ABO/RH(D): B POS
Antibody Screen: NEGATIVE

## 2024-06-23 LAB — PREGNANCY, URINE: Preg Test, Ur: NEGATIVE

## 2024-06-23 SURGERY — EGD (ESOPHAGOGASTRODUODENOSCOPY)
Anesthesia: General

## 2024-06-23 MED ORDER — VENLAFAXINE HCL ER 75 MG PO CP24
150.0000 mg | ORAL_CAPSULE | Freq: Every day | ORAL | Status: DC
  Administered 2024-06-23 – 2024-06-24 (×2): 150 mg via ORAL
  Filled 2024-06-23 (×2): qty 2

## 2024-06-23 MED ORDER — PEG 3350-KCL-NA BICARB-NACL 420 G PO SOLR
2000.0000 mL | Freq: Once | ORAL | Status: DC | PRN
  Filled 2024-06-23: qty 4000

## 2024-06-23 MED ORDER — PEG 3350-KCL-NA BICARB-NACL 420 G PO SOLR
4000.0000 mL | Freq: Once | ORAL | Status: AC
  Administered 2024-06-23: 4000 mL via ORAL
  Filled 2024-06-23 (×2): qty 4000

## 2024-06-23 MED ORDER — METHYLPHENIDATE HCL 10 MG PO TABS
10.0000 mg | ORAL_TABLET | Freq: Every day | ORAL | Status: DC
Start: 2024-06-23 — End: 2024-06-24
  Administered 2024-06-23 – 2024-06-24 (×2): 10 mg via ORAL
  Filled 2024-06-23 (×2): qty 1

## 2024-06-23 MED ORDER — FINERENONE 20 MG PO TABS: 20.0000 mg | ORAL_TABLET | Freq: Every day | ORAL | Status: DC

## 2024-06-23 MED ORDER — DIVALPROEX SODIUM ER 500 MG PO TB24
500.0000 mg | ORAL_TABLET | Freq: Three times a day (TID) | ORAL | Status: DC
  Administered 2024-06-23 – 2024-06-24 (×4): 500 mg via ORAL
  Filled 2024-06-23 (×5): qty 1

## 2024-06-23 MED ORDER — SODIUM CHLORIDE 0.9 % IV SOLN: INTRAVENOUS | Status: DC

## 2024-06-23 MED ORDER — KETOCONAZOLE 2 % EX CREA
1.0000 | TOPICAL_CREAM | Freq: Every day | CUTANEOUS | Status: DC
  Administered 2024-06-23 – 2024-06-24 (×2): 1 via TOPICAL
  Filled 2024-06-23: qty 15

## 2024-06-23 MED ORDER — FLUCONAZOLE 50 MG PO TABS
150.0000 mg | ORAL_TABLET | Freq: Every day | ORAL | Status: DC
  Administered 2024-06-23 – 2024-06-24 (×2): 150 mg via ORAL
  Filled 2024-06-23 (×2): qty 1

## 2024-06-23 MED ORDER — BUPROPION HCL ER (XL) 150 MG PO TB24
150.0000 mg | ORAL_TABLET | Freq: Every day | ORAL | Status: DC
  Administered 2024-06-23 – 2024-06-24 (×2): 150 mg via ORAL
  Filled 2024-06-23 (×2): qty 1

## 2024-06-23 MED ORDER — ROSUVASTATIN CALCIUM 10 MG PO TABS
40.0000 mg | ORAL_TABLET | Freq: Every day | ORAL | Status: DC
  Administered 2024-06-23 – 2024-06-24 (×2): 40 mg via ORAL
  Filled 2024-06-23 (×2): qty 4

## 2024-06-23 MED ORDER — PROPOFOL 10 MG/ML IV BOLUS
INTRAVENOUS | Status: DC | PRN
  Administered 2024-06-23: 110 mg via INTRAVENOUS
  Administered 2024-06-23: 50 mg via INTRAVENOUS
  Administered 2024-06-23: 40 mg via INTRAVENOUS

## 2024-06-23 MED ORDER — LEVOTHYROXINE SODIUM 100 MCG PO TABS
100.0000 ug | ORAL_TABLET | Freq: Every day | ORAL | Status: DC
  Administered 2024-06-23 – 2024-06-24 (×2): 100 ug via ORAL
  Filled 2024-06-23 (×2): qty 1

## 2024-06-23 MED ORDER — INSULIN GLARGINE-YFGN 100 UNIT/ML ~~LOC~~ SOLN
15.0000 [IU] | Freq: Every day | SUBCUTANEOUS | Status: DC
  Administered 2024-06-23 (×2): 15 [IU] via SUBCUTANEOUS
  Filled 2024-06-23 (×3): qty 0.15

## 2024-06-23 MED ORDER — ESTRADIOL 0.05 MG/24HR TD PTWK
0.0500 mg | MEDICATED_PATCH | TRANSDERMAL | Status: DC
  Administered 2024-06-23: 0.05 mg via TRANSDERMAL
  Filled 2024-06-23: qty 1

## 2024-06-23 MED ORDER — FERROUS SULFATE 325 (65 FE) MG PO TABS
325.0000 mg | ORAL_TABLET | Freq: Every day | ORAL | Status: DC
  Administered 2024-06-23: 325 mg via ORAL
  Filled 2024-06-23: qty 1

## 2024-06-23 MED ORDER — CLOTRIMAZOLE 1 % EX CREA
TOPICAL_CREAM | Freq: Two times a day (BID) | CUTANEOUS | Status: DC
  Filled 2024-06-23: qty 15

## 2024-06-23 MED ORDER — VALPROIC ACID 250 MG/5ML PO SOLN: 250.0000 mg | Freq: Two times a day (BID) | ORAL | Status: DC

## 2024-06-23 MED ORDER — LORATADINE 10 MG PO TABS
10.0000 mg | ORAL_TABLET | Freq: Every day | ORAL | Status: DC
  Administered 2024-06-23 – 2024-06-24 (×2): 10 mg via ORAL
  Filled 2024-06-23 (×2): qty 1

## 2024-06-23 MED ORDER — QUETIAPINE FUMARATE 25 MG PO TABS
50.0000 mg | ORAL_TABLET | Freq: Every day | ORAL | Status: DC
Start: 2024-06-23 — End: 2024-06-24
  Administered 2024-06-23 – 2024-06-24 (×2): 50 mg via ORAL
  Filled 2024-06-23 (×2): qty 2

## 2024-06-23 MED ORDER — LIDOCAINE HCL (CARDIAC) PF 100 MG/5ML IV SOSY
PREFILLED_SYRINGE | INTRAVENOUS | Status: DC | PRN
  Administered 2024-06-23: 100 mg via INTRAVENOUS

## 2024-06-23 MED ORDER — COLCHICINE 0.6 MG PO TABS
0.6000 mg | ORAL_TABLET | Freq: Every day | ORAL | Status: DC
  Administered 2024-06-23 – 2024-06-24 (×2): 0.6 mg via ORAL
  Filled 2024-06-23 (×2): qty 1

## 2024-06-23 MED ORDER — ZIPRASIDONE HCL 80 MG PO CAPS
80.0000 mg | ORAL_CAPSULE | Freq: Two times a day (BID) | ORAL | Status: DC
Start: 2024-06-23 — End: 2024-06-24
  Administered 2024-06-23 – 2024-06-24 (×3): 80 mg via ORAL
  Filled 2024-06-23 (×3): qty 1

## 2024-06-23 MED ORDER — PROPOFOL 10 MG/ML IV BOLUS
INTRAVENOUS | Status: AC
  Filled 2024-06-23: qty 20

## 2024-06-23 MED ORDER — PROGESTERONE MICRONIZED 100 MG PO CAPS
100.0000 mg | ORAL_CAPSULE | Freq: Every day | ORAL | Status: DC
  Administered 2024-06-23 – 2024-06-24 (×2): 100 mg via ORAL
  Filled 2024-06-23 (×2): qty 1

## 2024-06-23 MED ORDER — BUPROPION HCL ER (XL) 150 MG PO TB24: 300.0000 mg | ORAL_TABLET | Freq: Every day | ORAL | Status: DC

## 2024-06-23 NOTE — Transfer of Care (Signed)
 Immediate Anesthesia Transfer of Care Note  Patient: Mackenzie Randall  Procedure(s) Performed: EGD (ESOPHAGOGASTRODUODENOSCOPY)  Patient Location: Endoscopy Unit  Anesthesia Type:General  Level of Consciousness: awake, alert , and oriented  Airway & Oxygen Therapy: Patient Spontanous Breathing and Patient connected to face mask oxygen  Post-op Assessment: Report given to RN, Post -op Vital signs reviewed and stable, and Patient moving all extremities  Post vital signs: Reviewed and stable  Last Vitals:  Vitals Value Taken Time  BP 128/63 06/23/24 14:54  Temp    Pulse 89 06/23/24 14:55  Resp 26 06/23/24 14:55  SpO2 100 % 06/23/24 14:55  Vitals shown include unfiled device data.  Last Pain:  Vitals:   06/23/24 1454  TempSrc:   PainSc: 0-No pain         Complications: No notable events documented.

## 2024-06-23 NOTE — Op Note (Signed)
 [facility] Gastroenterology Patient Name: Mackenzie Randall Procedure Date: 06/23/2024 12:37 PM MRN: [id number] Account #: [id number] Date of Birth: [birth year] Admit Type: Inpatient Age: 53 Room: [facility] ENDO ROOM 4 Gender: Female Note Status: Finalized Instrument Name: Upper Endoscope [id number] Procedure:             Upper GI endoscopy Indications:           Acute post hemorrhagic anemia Providers:             Elspeth Ozell Onita ROSALEA, DO Referring MD:          [facility] (Referring MD) Medicines:             Monitored Anesthesia Care Complications:         No immediate complications. Estimated blood loss:                         Minimal. Procedure:             Pre-Anesthesia Assessment:                        - Prior to the procedure, a History and Physical was                         performed, and patient medications and allergies were                         reviewed. The patient is competent. The risks and                         benefits of the procedure and the sedation options and                         risks were discussed with the patient. All questions                         were answered and informed consent was obtained.                         Patient identification and proposed procedure were                         verified by the physician, the nurse, the anesthetist                         and the technician in the endoscopy suite. Mental                         Status Examination: alert and oriented. Airway                         Examination: normal oropharyngeal airway and neck                         mobility. Respiratory Examination: clear to                         auscultation. CV Examination: RRR, no murmurs, no S3  or S4. Prophylactic Antibiotics: The patient does not                         require prophylactic antibiotics. Prior                         Anticoagulants: The patient has taken no  anticoagulant                         or antiplatelet agents. ASA Grade Assessment: III - A                         patient with severe systemic disease. After reviewing                         the risks and benefits, the patient was deemed in                         satisfactory condition to undergo the procedure. The                         anesthesia plan was to use monitored anesthesia care                         (MAC). Immediately prior to administration of                         medications, the patient was re-assessed for adequacy                         to receive sedatives. The heart rate, respiratory                         rate, oxygen saturations, blood pressure, adequacy of                         pulmonary ventilation, and response to care were                         monitored throughout the procedure. The physical                         status of the patient was re-assessed after the                         procedure.                        After obtaining informed consent, the endoscope was                         passed under direct vision. Throughout the procedure,                         the patient's blood pressure, pulse, and oxygen                         saturations were monitored continuously. The Endoscope  was introduced through the mouth, and advanced to the                         second part of duodenum. The upper GI endoscopy was                         accomplished without difficulty. The patient tolerated                         the procedure well. Findings:      The duodenal bulb, first portion of the duodenum and second portion of       the duodenum were normal. Estimated blood loss: none.      Localized mild inflammation characterized by erosions was found on the       greater curvature of the stomach. Biopsies were taken with a cold       forceps for Helicobacter pylori testing. Estimated blood loss was       minimal.       The exam of the stomach was otherwise normal.      The Z-line was regular. Estimated blood loss: none.      Esophagogastric landmarks were identified: the gastroesophageal junction       was found at 35 cm from the incisors.      The exam of the esophagus was otherwise normal. Impression:            - Normal duodenal bulb, first portion of the duodenum                         and second portion of the duodenum.                        - Gastritis. Biopsied.                        - Z-line regular.                        - Esophagogastric landmarks identified.                        - Plan for colonoscopy tomorrow Recommendation:        - Patient has a contact number available for                         emergencies. The signs and symptoms of potential                         delayed complications were discussed with the patient.                         Return to normal activities tomorrow. Written                         discharge instructions were provided to the patient.                        - Return patient to hospital ward for ongoing care.                        -  Clear liquid diet today.                        - NPO at midnight.                        - No ibuprofen, naproxen, or other non-steroidal                         anti-inflammatory drugs.                        - Await pathology results.                        - The findings and recommendations were discussed with                         the patient.                        - The findings and recommendations were discussed with                         the patient's family.                        - The findings and recommendations were discussed with                         the referring physician. Procedure Code(s):     --- Professional ---                        [phone], Esophagogastroduodenoscopy, flexible,                         transoral; with biopsy, single or multiple Diagnosis Code(s):     --- Professional ---                         K29.70, Gastritis, unspecified, without bleeding                        D62, Acute posthemorrhagic anemia CPT copyright 2022 [employer] Association. All rights reserved. The codes documented in this report are preliminary and upon coder review may  be revised to meet current compliance requirements. Attending Participation:      I personally performed the entire procedure. Elspeth Jungling, DO Elspeth Ozell Jungling DO, DO 06/23/2024 2:57:16 PM This report has been signed electronically. Number of Addenda: 0 Note Initiated On: 06/23/2024 12:37 PM Estimated Blood Loss:  Estimated blood loss was minimal.      [facility]

## 2024-06-23 NOTE — Plan of Care (Signed)

## 2024-06-23 NOTE — Anesthesia Preprocedure Evaluation (Signed)
 Anesthesia Evaluation  Patient identified by MRN, date of birth, ID band Patient awake    Reviewed: Allergy & Precautions, NPO status , Patient's Chart, lab work & pertinent test results  History of Anesthesia Complications Negative for: history of anesthetic complications  Airway Mallampati: III  TM Distance: >3 FB Neck ROM: full    Dental  (+) Chipped   Pulmonary asthma , sleep apnea    Pulmonary exam normal        Cardiovascular Exercise Tolerance: Good hypertension, (-) angina Normal cardiovascular exam     Neuro/Psych  PSYCHIATRIC DISORDERS      negative neurological ROS     GI/Hepatic Neg liver ROS,GERD  Controlled,,  Endo/Other  diabetesHypothyroidism    Renal/GU Renal disease  negative genitourinary   Musculoskeletal   Abdominal   Peds  Hematology negative hematology ROS (+)   Anesthesia Other Findings Patient is NPO appropriate and reports no nausea or vomiting today.  Past Medical History: No date: Asthma No date: Diabetes mellitus without complication (HCC) No date: Hyperlipemia No date: Hypertension No date: Hypothyroidism No date: Schizophrenia (HCC) No date: Sleep apnea  Past Surgical History: No date: Denies surgical history  BMI    Body Mass Index: 41.09 kg/m      Reproductive/Obstetrics negative OB ROS                              Anesthesia Physical Anesthesia Plan  ASA: 3  Anesthesia Plan: General   Post-op Pain Management:    Induction: Intravenous  PONV Risk Score and Plan: Propofol  infusion and TIVA  Airway Management Planned: Natural Airway and Nasal Cannula  Additional Equipment:   Intra-op Plan:   Post-operative Plan:   Informed Consent: I have reviewed the patients History and Physical, chart, labs and discussed the procedure including the risks, benefits and alternatives for the proposed anesthesia with the patient or authorized  representative who has indicated his/her understanding and acceptance.     Dental Advisory Given  Plan Discussed with: Anesthesiologist, CRNA and Surgeon  Anesthesia Plan Comments: (Patient and family member, Mackenzie Randall [phone]), consented for risks of anesthesia including but not limited to:  - adverse reactions to medications - risk of airway placement if required - damage to eyes, teeth, lips or other oral mucosa - nerve damage due to positioning  - sore throat or hoarseness - Damage to heart, brain, nerves, lungs, other parts of body or loss of life  They voiced understanding and assent.)         Anesthesia Quick Evaluation

## 2024-06-23 NOTE — Anesthesia Postprocedure Evaluation (Signed)
 Anesthesia Post Note  Patient: Mackenzie Randall  Procedure(s) Performed: EGD (ESOPHAGOGASTRODUODENOSCOPY)  Patient location during evaluation: Endoscopy Anesthesia Type: General Level of consciousness: awake and alert Pain management: pain level controlled Vital Signs Assessment: post-procedure vital signs reviewed and stable Respiratory status: spontaneous breathing, nonlabored ventilation and respiratory function stable Cardiovascular status: blood pressure returned to baseline and stable Postop Assessment: no apparent nausea or vomiting Anesthetic complications: no   No notable events documented.   Last Vitals:  Vitals:   06/23/24 1504 06/23/24 1514  BP: 125/78 118/73  Pulse: 79 82  Resp: 16 16  Temp:    SpO2: 98% 100%    Last Pain:  Vitals:   06/23/24 1514  TempSrc:   PainSc: 0-No pain                 Fairy POUR [name]

## 2024-06-23 NOTE — Plan of Care (Signed)

## 2024-06-23 NOTE — H&P (View-Only) (Signed)
 Inpatient Consultation   Patient ID: Mackenzie Randall is a 53 y.o. female.  Requesting Provider: Caleb Exon, MD  Date of Admission: 06/22/2024  Date of Consult: 06/23/24   Reason for Consultation: GIB   Patient's Chief Complaint:   Chief Complaint  Patient presents with   Dizziness   Hypotension    53 year old African-American female with schizophrenia, GERD, hypertension DM2 HLD, depression and anxiety, obesity, CKD, OSA who presented to the hospital with dizziness and 1 episode of dark stool.  Patient started feeling dizzy yesterday afternoon without syncopal episode.  She did have an episode of dark stool she reports but no hematochezia.  Hemoglobin was found to be 9.2 from 11.2 baseline.  She reports she had belly pain yesterday but does not have any today.  She has been n.p.o. since midnight.  Currently feeling better today aside from hunger.  No nausea or vomiting.  Appetite has otherwise been stable at her group home.  Denies any dysphagia or odynophagia.  Denies any other GI complaints.   Blood pressure was 91/53 in the emergency department and improved with IV fluid resuscitation.  Recently treated for UTI  Currently takes Mobic and aspirin Denies Anti-plt agents, and anticoagulants Denies family history of gastrointestinal disease and malignancy Previous Endoscopies: none Cologuard negative 11/2023    Past Medical History:  Diagnosis Date   Asthma    Diabetes mellitus without complication (HCC)    Hyperlipemia    Hypertension    Hypothyroidism    Schizophrenia (HCC)    Sleep apnea     Past Surgical History:  Procedure Laterality Date   Denies surgical history      No Known Allergies  Family History  Problem Relation Age of Onset   Diabetes Mother    Hypertension Mother    Sleep apnea Father    Heart attack Father    Gout Maternal Grandfather    Breast cancer Neg Hx     Social History   Tobacco Use   Smoking status: Never   Smokeless  tobacco: Current    Types: Chew, Snuff   Tobacco comments:    Reports remote history of cigarette use. Unsure what year quit as has been so long ago.  Vaping Use   Vaping status: Never Used  Substance Use Topics   Alcohol use: Not Currently   Drug use: Never     Pertinent GI related history and allergies were reviewed with the patient  Review of Systems  Constitutional:  Positive for activity change. Negative for appetite change, chills, diaphoresis, fatigue, fever and unexpected weight change.  HENT:  Negative for trouble swallowing and voice change.   Respiratory:  Negative for shortness of breath and wheezing.   Cardiovascular:  Negative for chest pain, palpitations and leg swelling.  Gastrointestinal:  Positive for abdominal pain. Negative for abdominal distention, anal bleeding, blood in stool, constipation, diarrhea, nausea, rectal pain and vomiting.  Musculoskeletal:  Negative for myalgias.  Skin:  Negative for color change and pallor.  Neurological:  Positive for dizziness and weakness. Negative for syncope.  Psychiatric/Behavioral:  Negative for confusion.   All other systems reviewed and are negative.    Medications Home Medications No current facility-administered medications on file prior to encounter.   Current Outpatient Medications on File Prior to Encounter  Medication Sig Dispense Refill   albuterol  (VENTOLIN  HFA) 108 (90 Base) MCG/ACT inhaler Inhale 2 puffs into the lungs every 6 (six) hours as needed.     amLODipine (NORVASC)  10 MG tablet Take 10 mg by mouth.     aspirin EC 81 MG tablet Take 81 mg by mouth.     buPROPion  (WELLBUTRIN  XL) 150 MG 24 hr tablet Take 150 mg by mouth daily.     carvedilol (COREG) 25 MG tablet Take 25 mg by mouth daily.     cetirizine (ZYRTEC) 10 MG tablet Take 10 mg by mouth daily.     clobetasol  ointment (TEMOVATE ) 0.05 % Apply once a day for 4 weeks, then every other day for 4 weeks, then once a week as needed. 30 g 5   cloNIDine  (CATAPRES) 0.1 MG tablet Take 0.1 mg by mouth daily.     clotrimazole  (LOTRIMIN ) 1 % cream Apply topically 2 (two) times daily.     colchicine  0.6 MG tablet Take 0.6 mg by mouth.     desvenlafaxine (PRISTIQ) 100 MG 24 hr tablet Take 100 mg by mouth daily.     divalproex  (DEPAKOTE  ER) 500 MG 24 hr tablet Take 500 mg by mouth 3 (three) times daily.     estradiol  (CLIMARA  - DOSED IN MG/24 HR) 0.025 mg/24hr patch Place 1 patch (0.025 mg total) onto the skin once a week. 12 patch 3   FARXIGA 10 MG TABS tablet Take 10 mg by mouth daily.     ferrous sulfate  324 (65 Fe) MG TBEC Take 324 mg by mouth.     fluticasone (FLONASE) 50 MCG/ACT nasal spray Place into both nostrils.     fluticasone-salmeterol (ADVAIR) 250-50 MCG/ACT AEPB Inhale 1 puff into the lungs.     furosemide (LASIX) 20 MG tablet Take 20 mg by mouth daily.     gabapentin (NEURONTIN) 100 MG capsule Take by mouth.     hydrochlorothiazide (HYDRODIURIL) 25 MG tablet Take 25 mg by mouth daily.     JARDIANCE 25 MG TABS tablet Take 25 mg by mouth daily.     KERENDIA  20 MG TABS Take by mouth.     ketoconazole  (NIZORAL ) 2 % cream Apply 1 Application topically daily. For 4 wks 60 g 2   KLOR-CON M20 20 MEQ tablet Take by mouth.     LANTUS  SOLOSTAR 100 UNIT/ML Solostar Pen Inject into the skin.     levothyroxine  (SYNTHROID ) 100 MCG tablet Take 100 mcg by mouth daily.     lisinopril (ZESTRIL) 40 MG tablet Take 40 mg by mouth daily.     meloxicam (MOBIC) 15 MG tablet Take 15 mg by mouth daily.     methylphenidate  (RITALIN ) 10 MG tablet Take 10 mg by mouth daily.     nitrofurantoin, macrocrystal-monohydrate, (MACROBID) 100 MG capsule Take 100 mg by mouth 2 (two) times daily.     omeprazole (PRILOSEC) 20 MG capsule Take 20 mg by mouth daily.     pantoprazole  (PROTONIX ) 20 MG tablet Take 20 mg by mouth.     pravastatin (PRAVACHOL) 40 MG tablet Take 40 mg by mouth daily.     progesterone  (PROMETRIUM ) 100 MG capsule Take 1 capsule (100 mg total) by mouth  daily. 90 capsule 0   QUEtiapine  (SEROQUEL ) 50 MG tablet Take by mouth.     rosuvastatin  (CRESTOR ) 40 MG tablet Take 40 mg by mouth daily.     sitaGLIPtin (JANUVIA) 100 MG tablet Take 100 mg by mouth daily.     STIOLTO RESPIMAT 2.5-2.5 MCG/ACT AERS SMARTSIG:2 Puff(s) Via Inhaler Daily     traZODone (DESYREL) 100 MG tablet Take 100 mg by mouth at bedtime.  ziprasidone  (GEODON ) 80 MG capsule Take 80 mg by mouth 2 (two) times daily.     ACCU-CHEK GUIDE TEST test strip      Accu-Chek Softclix Lancets lancets SMARTSIG:Topical     etonogestrel  (NEXPLANON ) 68 MG IMPL implant 1 each by Subdermal route once.     metolazone (ZAROXOLYN) 2.5 MG tablet Take 2.5 mg by mouth daily.     nystatin  (MYCOSTATIN /NYSTOP ) powder Apply 1 application topically 3 (three) times daily. (Patient not taking: Reported on 06/23/2024) 15 g 0   SURE COMFORT PEN NEEDLES 31G X 5 MM MISC      TRULICITY 1.5 MG/0.5ML SOAJ Inject into the skin.     Pertinent GI related medications were reviewed with the patient  Inpatient Medications  Current Facility-Administered Medications:    acetaminophen  (TYLENOL ) tablet 650 mg, 650 mg, Oral, Q6H PRN, Niu, Xilin, MD   albuterol  (PROVENTIL ) (2.5 MG/3ML) 0.083% nebulizer solution 3 mL, 3 mL, Nebulization, Q4H PRN, Niu, Xilin, MD   buPROPion  (WELLBUTRIN  XL) 24 hr tablet 150 mg, 150 mg, Oral, Daily, Belue, Rankin RAMAN, RPH   cefTRIAXone  (ROCEPHIN ) 2 g in sodium chloride  0.9 % 100 mL IVPB, 2 g, Intravenous, Q24H, Niu, Xilin, MD, Last Rate: 200 mL/hr at 06/22/24 2227, 2 g at 06/22/24 2227   clotrimazole  (LOTRIMIN ) 1 % cream, , Topical, BID, Niu, Xilin, MD   colchicine  tablet 0.6 mg, 0.6 mg, Oral, Daily, Niu, Xilin, MD   dextromethorphan-guaiFENesin  (MUCINEX  DM) 30-600 MG per 12 hr tablet 1 tablet, 1 tablet, Oral, BID PRN, Niu, Xilin, MD   divalproex  (DEPAKOTE  ER) 24 hr tablet 500 mg, 500 mg, Oral, TID, Niu, Xilin, MD   estradiol  (CLIMARA  - Dosed in mg/24 hr) patch 0.05 mg, 0.05 mg, Transdermal,  Weekly, Niu, Xilin, MD   ferrous sulfate  tablet 325 mg, 325 mg, Oral, Q1400, Niu, Xilin, MD   fluconazole  (DIFLUCAN ) tablet 150 mg, 150 mg, Oral, Daily, Niu, Xilin, MD   hydrALAZINE  (APRESOLINE ) injection 5 mg, 5 mg, Intravenous, Q2H PRN, Niu, Xilin, MD   insulin  aspart (novoLOG ) injection 0-5 Units, 0-5 Units, Subcutaneous, QHS, Niu, Xilin, MD   insulin  aspart (novoLOG ) injection 0-9 Units, 0-9 Units, Subcutaneous, TID WC, Niu, Xilin, MD   insulin  glargine-yfgn (SEMGLEE ) injection 15 Units, 15 Units, Subcutaneous, QHS, Niu, Xilin, MD, 15 Units at 06/23/24 0158   ketoconazole  (NIZORAL ) 2 % cream 1 Application, 1 Application, Topical, Daily, Niu, Xilin, MD   levothyroxine  (SYNTHROID ) tablet 100 mcg, 100 mcg, Oral, Q0600, Niu, Xilin, MD, 100 mcg at 06/23/24 9461   loratadine  (CLARITIN ) tablet 10 mg, 10 mg, Oral, Daily, Niu, Xilin, MD   methylphenidate  (RITALIN ) tablet 10 mg, 10 mg, Oral, Daily, Niu, Xilin, MD   ondansetron  (ZOFRAN ) injection 4 mg, 4 mg, Intravenous, Q8H PRN, Niu, Xilin, MD   Oral care mouth rinse, 15 mL, Mouth Rinse, PRN, Niu, Xilin, MD   oxyCODONE -acetaminophen  (PERCOCET/ROXICET) 5-325 MG per tablet 1 tablet, 1 tablet, Oral, Q4H PRN, Niu, Xilin, MD   pantoprazole  (PROTONIX ) injection 40 mg, 40 mg, Intravenous, Q12H, Niu, Xilin, MD, 40 mg at 06/22/24 2222   progesterone  (PROMETRIUM ) capsule 100 mg, 100 mg, Oral, Daily, Niu, Xilin, MD   QUEtiapine  (SEROQUEL ) tablet 50 mg, 50 mg, Oral, Daily, Niu, Xilin, MD   rosuvastatin  (CRESTOR ) tablet 40 mg, 40 mg, Oral, Daily, Niu, Xilin, MD   venlafaxine  XR (EFFEXOR -XR) 24 hr capsule 150 mg, 150 mg, Oral, Q breakfast, Niu, Xilin, MD   ziprasidone  (GEODON ) capsule 80 mg, 80 mg, Oral, BID, Niu, Xilin, MD  cefTRIAXone  (ROCEPHIN )  IV 2 g (06/22/24 2227)    acetaminophen , albuterol , dextromethorphan-guaiFENesin , hydrALAZINE , ondansetron  (ZOFRAN ) IV, mouth rinse, oxyCODONE -acetaminophen    Objective   Vitals:   06/22/24 1905 06/22/24 2100  06/22/24 2217 06/23/24 0336  BP: (!) 110/59 115/76 (!) 118/57 (!) 95/59  Pulse: 74 69 72 77  Resp: 18 (!) 21  17  Temp:  97.8 F (36.6 C) 98.1 F (36.7 C) 98.2 F (36.8 C)  TempSrc:  Oral    SpO2: 100% 100% 100% 97%  Weight:  112 kg    Height:  5' 5 (1.651 m)       Physical Exam Vitals and nursing note reviewed.  Constitutional:      General: She is not in acute distress.    Appearance: She is obese. She is not ill-appearing, toxic-appearing or diaphoretic.  HENT:     Head: Normocephalic and atraumatic.     Nose: Nose normal.     Mouth/Throat:     Mouth: Mucous membranes are moist.     Pharynx: Oropharynx is clear.  Eyes:     General: No scleral icterus.    Extraocular Movements: Extraocular movements intact.  Cardiovascular:     Rate and Rhythm: Normal rate and regular rhythm.     Heart sounds: Normal heart sounds. No murmur heard.    No friction rub. No gallop.  Pulmonary:     Effort: Pulmonary effort is normal. No respiratory distress.     Breath sounds: Normal breath sounds. No wheezing, rhonchi or rales.  Abdominal:     General: Bowel sounds are normal. There is no distension.     Palpations: Abdomen is soft.     Tenderness: There is no abdominal tenderness. There is no guarding or rebound.  Musculoskeletal:     Cervical back: Neck supple.     Right lower leg: Edema present.     Left lower leg: Edema present.  Skin:    General: Skin is warm and dry.     Coloration: Skin is not jaundiced or pale.  Neurological:     Mental Status: She is alert and oriented to person, place, and time. Mental status is at baseline.     Laboratory Data Recent Labs  Lab 06/22/24 1745 06/22/24 2355 06/23/24 0545  WBC 8.7 8.3 8.1  HGB 8.6* 9.2* 8.8*  HCT 26.2* 28.4* 26.8*  PLT 188 183 201   Recent Labs  Lab 06/22/24 1745  NA 137  K 4.7  CL 103  CO2 24  BUN 46*  CALCIUM  9.5  PROT 7.3  BILITOT 0.9  ALKPHOS 42  ALT 21  AST 46*  GLUCOSE 166*   Recent Labs  Lab  06/22/24 2355  INR 1.0    No results for input(s): LIPASE in the last 72 hours.      Imaging Studies: DG Knee 1-2 Views Left Result Date: 06/22/2024 CLINICAL DATA:  Left knee pain, fall EXAM: LEFT KNEE - 1-2 VIEW COMPARISON:  None FINDINGS: Normal alignment. No acute fracture or dislocation. 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 Milder degenerative changes noted within the medial and lateral compartments. No effusion. Mild prepatellar edema. Soft tissues are otherwise unremarkable. IMPRESSION: 1. Advanced patellofemoral degenerative arthritis. No acute fracture or dislocation. Electronically Signed   By: Dorethia Molt M.D.   On: 06/22/2024 22:57   CT Cervical Spine Wo Contrast Result Date: 06/22/2024 CLINICAL DATA:  Bone lesion, cervical spine, incidental EXAM: CT CERVICAL SPINE WITHOUT CONTRAST TECHNIQUE: Multidetector CT imaging of the cervical  spine was performed without intravenous contrast. Multiplanar CT image reconstructions were also generated. RADIATION DOSE REDUCTION: This exam was performed according to the departmental dose-optimization program which includes automated exposure control, adjustment of the mA and/or kV according to patient size and/or use of iterative reconstruction technique. COMPARISON:  None Available. FINDINGS: Alignment: Normal. Skull base and vertebrae: No acute fracture. No primary bone lesion or focal pathologic process. Soft tissues and spinal canal: No prevertebral fluid or swelling. No visible canal hematoma. Disc levels: Disc spaces are maintained. Mild anterior spurring in the mid and lower cervical spine. Upper chest: No acute findings Other: None IMPRESSION: No acute bony abnormality. Electronically Signed   By: Franky Crease M.D.   On: 06/22/2024 18:33   CT ABDOMEN PELVIS WO CONTRAST Result Date: 06/22/2024 CLINICAL DATA:  Abdominal pain, acute, nonlocalized EXAM: CT ABDOMEN AND PELVIS WITHOUT CONTRAST TECHNIQUE:  Multidetector CT imaging of the abdomen and pelvis was performed following the standard protocol without IV contrast. RADIATION DOSE REDUCTION: This exam was performed according to the departmental dose-optimization program which includes automated exposure control, adjustment of the mA and/or kV according to patient size and/or use of iterative reconstruction technique. COMPARISON:  None Available. FINDINGS: Lower chest: No acute findings Hepatobiliary: No focal hepatic abnormality. Gallbladder unremarkable. Pancreas: No focal abnormality or ductal dilatation. Spleen: No focal abnormality.  Normal size. Adrenals/Urinary Tract: No adrenal abnormality. No focal renal abnormality. No stones or hydronephrosis. Urinary bladder is unremarkable. Stomach/Bowel: Normal appendix. Stomach, large and small bowel grossly unremarkable. Vascular/Lymphatic: No evidence of aneurysm or adenopathy. Reproductive: No visible focal abnormality. Other: Trace free fluid in the cul-de-sac.  No free air. Musculoskeletal: No acute bony abnormality. IMPRESSION: No acute findings in the abdomen or pelvis. Electronically Signed   By: Franky Crease M.D.   On: 06/22/2024 18:31   CT HEAD WO CONTRAST ( ) Result Date: 06/22/2024 CLINICAL DATA:  Head trauma, abnormal mental status (Age 36-64y). Dizziness. EXAM: CT HEAD WITHOUT CONTRAST TECHNIQUE: Contiguous axial images were obtained from the base of the skull through the vertex without intravenous contrast. RADIATION DOSE REDUCTION: This exam was performed according to the departmental dose-optimization program which includes automated exposure control, adjustment of the mA and/or kV according to patient size and/or use of iterative reconstruction technique. COMPARISON:  None Available. FINDINGS: Brain: No acute intracranial abnormality. Specifically, no hemorrhage, hydrocephalus, mass lesion, acute infarction, or significant intracranial injury. Vascular: No hyperdense vessel or unexpected  calcification. Skull: No acute calvarial abnormality. Sinuses/Orbits: No acute findings Other: None IMPRESSION: No acute intracranial abnormality. Electronically Signed   By: Franky Crease M.D.   On: 06/22/2024 18:26    Assessment:   # Suspect UGIB - report of dark stool - acute on chronic anemia  # Acute on chronic anemia - no iron deficiency - sat 19%, ferritin >1500 - hgb base 11.2- today is 8.8 and stabilized  # Cognitive impairment - answers questions appropriately, slow verbal response - was able to speak to patient's nephew, Mackenzie Randall- [phone]  # depression/anxiety  # gerd  # OSA  # Obesity  # Schizophrenia  # DM2  # CKD  Plan:  Esophagogastroduodenoscopy planned for today pending patient stability and endoscopy suite availability NPO at now Labs reviewed Protonix  40 mg iv q12 h Hold dvt ppx Monitor H&H.  Transfusion and resuscitation as per primary team Avoid frequent lab draws to prevent lab induced anemia Supportive care and antiemetics as per primary team Maintain two sites IV access Avoid nsaids Monitor for GIB.  Due to patient's cognitive delay, I called her listed contact [name] at the listed number.  The number was answered by a family member who reported that Ms. Ileana is very sick dealing with cancer and dementia at this time.  Ms. Vonzella son, Mackenzie Ileana (Megyn's nephew), was able to come on the phone and was willing to help on their behalf.  I discussed the current GI issues that Ms. Chaddock is hospitalized for and the need for upper endoscopy.  We discussed the risks and benefits.  And he is consented to the procedure for the patient.  Esophagogastroduodenoscopy with possible biopsy, control of bleeding, polypectomy, and interventions as necessary has been discussed with the patient/patient representative. Informed consent was obtained from the patient/patient representative after explaining the indication, nature, and risks of  the procedure including but not limited to death, bleeding, perforation, missed neoplasm/lesions, cardiorespiratory compromise, and reaction to medications. Opportunity for questions was given and appropriate answers were provided. Patient/patient representative has verbalized understanding is amenable to undergoing the procedure.  Management of other medical comorbidities as per primary team  I personally performed the service.  Thank you for allowing us  to participate in this patient's care. Please don't hesitate to call if any questions or concerns arise.   Elspeth Ozell Jungling, DO [facility]  Portions of the record may have been created with voice recognition software. Occasional wrong-word or 'sound-a-like' substitutions may have occurred due to the inherent limitations of voice recognition software.  Read the chart carefully and recognize, using context, where substitutions may have occurred.

## 2024-06-23 NOTE — Care Plan (Addendum)
 Brief GI Care Plan  Patient underwent EGD.  Tolerated well.  Only remarkable for mild gastritis in the greater body with a few erosions.  This was biopsied for H. pylori. Continue once daily by mouth PPI.  Will plan for colonoscopy tomorrow with Dr. Jinny Clear liquids today N.p.o. at midnight GoLytely prep to start today.  Can drink GoLytely prep despite n.p.o. order.  Additional as needed GoLytely ordered if stools not clear after first gallon. CBC and BMP in the a.m.  I attempted to reach [name], [name], but received voicemail and left callback.  Was not able to discuss consent for the procedure at this time.  Addendum: [name] called back and I was able to discuss the procedure findings and discuss colonoscopy consent with him. Questions were answered and he verbalized understanding. Consented to patient undergoing colonoscopy.  Colonoscopy with possible biopsy, control of bleeding, polypectomy, and interventions as necessary has been discussed with the patient/patient representative. Informed consent was obtained from the patient/patient representative after explaining the indication, nature, and risks of the procedure including but not limited to death, bleeding, perforation, missed neoplasm/lesions, cardiorespiratory compromise, and reaction to medications. Opportunity for questions was given and appropriate answers were provided. Patient/patient representative has verbalized understanding is amenable to undergoing the procedure.   Mackenzie EMERSON Jungling, DO [facility]

## 2024-06-23 NOTE — Interval H&P Note (Signed)
 History and Physical Interval Note: Preprocedure H&P from 06/23/24  was reviewed and there was no interval change after seeing and examining the patient.  Written consent was obtained from the patient's nephew after discussion of risks, benefits, and alternatives. Patient's nephew has consented to proceed with Esophagogastroduodenoscopy with possible intervention   06/23/2024 2:58 PM  Mackenzie Randall  has presented today for surgery, with the diagnosis of melena, anemia.  The various methods of treatment have been discussed with the patient and family. After consideration of risks, benefits and other options for treatment, the patient has consented to  Procedure(s): EGD (ESOPHAGOGASTRODUODENOSCOPY) (N/A) as a surgical intervention.  The patient's history has been reviewed, patient examined, no change in status, stable for surgery.  I have reviewed the patient's chart and labs.  Questions were answered to the patient's satisfaction.     Elspeth Ozell Jungling

## 2024-06-23 NOTE — Consult Note (Signed)
 Inpatient Consultation   Patient ID: Mackenzie Randall is a 53 y.o. female.  Requesting Provider: Caleb Exon, MD  Date of Admission: 06/22/2024  Date of Consult: 06/23/24   Reason for Consultation: GIB   Patient's Chief Complaint:   Chief Complaint  Patient presents with   Dizziness   Hypotension    53 year old African-American female with schizophrenia, GERD, hypertension DM2 HLD, depression and anxiety, obesity, CKD, OSA who presented to the hospital with dizziness and 1 episode of dark stool.  Patient started feeling dizzy yesterday afternoon without syncopal episode.  She did have an episode of dark stool she reports but no hematochezia.  Hemoglobin was found to be 9.2 from 11.2 baseline.  She reports she had belly pain yesterday but does not have any today.  She has been n.p.o. since midnight.  Currently feeling better today aside from hunger.  No nausea or vomiting.  Appetite has otherwise been stable at her group home.  Denies any dysphagia or odynophagia.  Denies any other GI complaints.   Blood pressure was 91/53 in the emergency department and improved with IV fluid resuscitation.  Recently treated for UTI  Currently takes Mobic and aspirin Denies Anti-plt agents, and anticoagulants Denies family history of gastrointestinal disease and malignancy Previous Endoscopies: none Cologuard negative 11/2023    Past Medical History:  Diagnosis Date   Asthma    Diabetes mellitus without complication (HCC)    Hyperlipemia    Hypertension    Hypothyroidism    Schizophrenia (HCC)    Sleep apnea     Past Surgical History:  Procedure Laterality Date   Denies surgical history      No Known Allergies  Family History  Problem Relation Age of Onset   Diabetes Mother    Hypertension Mother    Sleep apnea Father    Heart attack Father    Gout Maternal Grandfather    Breast cancer Neg Hx     Social History   Tobacco Use   Smoking status: Never   Smokeless  tobacco: Current    Types: Chew, Snuff   Tobacco comments:    Reports remote history of cigarette use. Unsure what year quit as has been so long ago.  Vaping Use   Vaping status: Never Used  Substance Use Topics   Alcohol use: Not Currently   Drug use: Never     Pertinent GI related history and allergies were reviewed with the patient  Review of Systems  Constitutional:  Positive for activity change. Negative for appetite change, chills, diaphoresis, fatigue, fever and unexpected weight change.  HENT:  Negative for trouble swallowing and voice change.   Respiratory:  Negative for shortness of breath and wheezing.   Cardiovascular:  Negative for chest pain, palpitations and leg swelling.  Gastrointestinal:  Positive for abdominal pain. Negative for abdominal distention, anal bleeding, blood in stool, constipation, diarrhea, nausea, rectal pain and vomiting.  Musculoskeletal:  Negative for myalgias.  Skin:  Negative for color change and pallor.  Neurological:  Positive for dizziness and weakness. Negative for syncope.  Psychiatric/Behavioral:  Negative for confusion.   All other systems reviewed and are negative.    Medications Home Medications No current facility-administered medications on file prior to encounter.   Current Outpatient Medications on File Prior to Encounter  Medication Sig Dispense Refill   albuterol  (VENTOLIN  HFA) 108 (90 Base) MCG/ACT inhaler Inhale 2 puffs into the lungs every 6 (six) hours as needed.     amLODipine (NORVASC)  10 MG tablet Take 10 mg by mouth.     aspirin EC 81 MG tablet Take 81 mg by mouth.     buPROPion  (WELLBUTRIN  XL) 150 MG 24 hr tablet Take 150 mg by mouth daily.     carvedilol (COREG) 25 MG tablet Take 25 mg by mouth daily.     cetirizine (ZYRTEC) 10 MG tablet Take 10 mg by mouth daily.     clobetasol  ointment (TEMOVATE ) 0.05 % Apply once a day for 4 weeks, then every other day for 4 weeks, then once a week as needed. 30 g 5   cloNIDine  (CATAPRES) 0.1 MG tablet Take 0.1 mg by mouth daily.     clotrimazole  (LOTRIMIN ) 1 % cream Apply topically 2 (two) times daily.     colchicine  0.6 MG tablet Take 0.6 mg by mouth.     desvenlafaxine (PRISTIQ) 100 MG 24 hr tablet Take 100 mg by mouth daily.     divalproex  (DEPAKOTE  ER) 500 MG 24 hr tablet Take 500 mg by mouth 3 (three) times daily.     estradiol  (CLIMARA  - DOSED IN MG/24 HR) 0.025 mg/24hr patch Place 1 patch (0.025 mg total) onto the skin once a week. 12 patch 3   FARXIGA 10 MG TABS tablet Take 10 mg by mouth daily.     ferrous sulfate  324 (65 Fe) MG TBEC Take 324 mg by mouth.     fluticasone (FLONASE) 50 MCG/ACT nasal spray Place into both nostrils.     fluticasone-salmeterol (ADVAIR) 250-50 MCG/ACT AEPB Inhale 1 puff into the lungs.     furosemide (LASIX) 20 MG tablet Take 20 mg by mouth daily.     gabapentin (NEURONTIN) 100 MG capsule Take by mouth.     hydrochlorothiazide (HYDRODIURIL) 25 MG tablet Take 25 mg by mouth daily.     JARDIANCE 25 MG TABS tablet Take 25 mg by mouth daily.     KERENDIA  20 MG TABS Take by mouth.     ketoconazole  (NIZORAL ) 2 % cream Apply 1 Application topically daily. For 4 wks 60 g 2   KLOR-CON M20 20 MEQ tablet Take by mouth.     LANTUS  SOLOSTAR 100 UNIT/ML Solostar Pen Inject into the skin.     levothyroxine  (SYNTHROID ) 100 MCG tablet Take 100 mcg by mouth daily.     lisinopril (ZESTRIL) 40 MG tablet Take 40 mg by mouth daily.     meloxicam (MOBIC) 15 MG tablet Take 15 mg by mouth daily.     methylphenidate  (RITALIN ) 10 MG tablet Take 10 mg by mouth daily.     nitrofurantoin, macrocrystal-monohydrate, (MACROBID) 100 MG capsule Take 100 mg by mouth 2 (two) times daily.     omeprazole (PRILOSEC) 20 MG capsule Take 20 mg by mouth daily.     pantoprazole  (PROTONIX ) 20 MG tablet Take 20 mg by mouth.     pravastatin (PRAVACHOL) 40 MG tablet Take 40 mg by mouth daily.     progesterone  (PROMETRIUM ) 100 MG capsule Take 1 capsule (100 mg total) by mouth  daily. 90 capsule 0   QUEtiapine  (SEROQUEL ) 50 MG tablet Take by mouth.     rosuvastatin  (CRESTOR ) 40 MG tablet Take 40 mg by mouth daily.     sitaGLIPtin (JANUVIA) 100 MG tablet Take 100 mg by mouth daily.     STIOLTO RESPIMAT 2.5-2.5 MCG/ACT AERS SMARTSIG:2 Puff(s) Via Inhaler Daily     traZODone (DESYREL) 100 MG tablet Take 100 mg by mouth at bedtime.  ziprasidone  (GEODON ) 80 MG capsule Take 80 mg by mouth 2 (two) times daily.     ACCU-CHEK GUIDE TEST test strip      Accu-Chek Softclix Lancets lancets SMARTSIG:Topical     etonogestrel  (NEXPLANON ) 68 MG IMPL implant 1 each by Subdermal route once.     metolazone (ZAROXOLYN) 2.5 MG tablet Take 2.5 mg by mouth daily.     nystatin  (MYCOSTATIN /NYSTOP ) powder Apply 1 application topically 3 (three) times daily. (Patient not taking: Reported on 06/23/2024) 15 g 0   SURE COMFORT PEN NEEDLES 31G X 5 MM MISC      TRULICITY 1.5 MG/0.5ML SOAJ Inject into the skin.     Pertinent GI related medications were reviewed with the patient  Inpatient Medications  Current Facility-Administered Medications:    acetaminophen  (TYLENOL ) tablet 650 mg, 650 mg, Oral, Q6H PRN, Niu, Xilin, MD   albuterol  (PROVENTIL ) (2.5 MG/3ML) 0.083% nebulizer solution 3 mL, 3 mL, Nebulization, Q4H PRN, Niu, Xilin, MD   buPROPion  (WELLBUTRIN  XL) 24 hr tablet 150 mg, 150 mg, Oral, Daily, Belue, Rankin RAMAN, RPH   cefTRIAXone  (ROCEPHIN ) 2 g in sodium chloride  0.9 % 100 mL IVPB, 2 g, Intravenous, Q24H, Niu, Xilin, MD, Last Rate: 200 mL/hr at 06/22/24 2227, 2 g at 06/22/24 2227   clotrimazole  (LOTRIMIN ) 1 % cream, , Topical, BID, Niu, Xilin, MD   colchicine  tablet 0.6 mg, 0.6 mg, Oral, Daily, Niu, Xilin, MD   dextromethorphan-guaiFENesin  (MUCINEX  DM) 30-600 MG per 12 hr tablet 1 tablet, 1 tablet, Oral, BID PRN, Niu, Xilin, MD   divalproex  (DEPAKOTE  ER) 24 hr tablet 500 mg, 500 mg, Oral, TID, Niu, Xilin, MD   estradiol  (CLIMARA  - Dosed in mg/24 hr) patch 0.05 mg, 0.05 mg, Transdermal,  Weekly, Niu, Xilin, MD   ferrous sulfate  tablet 325 mg, 325 mg, Oral, Q1400, Niu, Xilin, MD   fluconazole  (DIFLUCAN ) tablet 150 mg, 150 mg, Oral, Daily, Niu, Xilin, MD   hydrALAZINE  (APRESOLINE ) injection 5 mg, 5 mg, Intravenous, Q2H PRN, Niu, Xilin, MD   insulin  aspart (novoLOG ) injection 0-5 Units, 0-5 Units, Subcutaneous, QHS, Niu, Xilin, MD   insulin  aspart (novoLOG ) injection 0-9 Units, 0-9 Units, Subcutaneous, TID WC, Niu, Xilin, MD   insulin  glargine-yfgn (SEMGLEE ) injection 15 Units, 15 Units, Subcutaneous, QHS, Niu, Xilin, MD, 15 Units at 06/23/24 0158   ketoconazole  (NIZORAL ) 2 % cream 1 Application, 1 Application, Topical, Daily, Niu, Xilin, MD   levothyroxine  (SYNTHROID ) tablet 100 mcg, 100 mcg, Oral, Q0600, Niu, Xilin, MD, 100 mcg at 06/23/24 9461   loratadine  (CLARITIN ) tablet 10 mg, 10 mg, Oral, Daily, Niu, Xilin, MD   methylphenidate  (RITALIN ) tablet 10 mg, 10 mg, Oral, Daily, Niu, Xilin, MD   ondansetron  (ZOFRAN ) injection 4 mg, 4 mg, Intravenous, Q8H PRN, Niu, Xilin, MD   Oral care mouth rinse, 15 mL, Mouth Rinse, PRN, Niu, Xilin, MD   oxyCODONE -acetaminophen  (PERCOCET/ROXICET) 5-325 MG per tablet 1 tablet, 1 tablet, Oral, Q4H PRN, Niu, Xilin, MD   pantoprazole  (PROTONIX ) injection 40 mg, 40 mg, Intravenous, Q12H, Niu, Xilin, MD, 40 mg at 06/22/24 2222   progesterone  (PROMETRIUM ) capsule 100 mg, 100 mg, Oral, Daily, Niu, Xilin, MD   QUEtiapine  (SEROQUEL ) tablet 50 mg, 50 mg, Oral, Daily, Niu, Xilin, MD   rosuvastatin  (CRESTOR ) tablet 40 mg, 40 mg, Oral, Daily, Niu, Xilin, MD   venlafaxine  XR (EFFEXOR -XR) 24 hr capsule 150 mg, 150 mg, Oral, Q breakfast, Niu, Xilin, MD   ziprasidone  (GEODON ) capsule 80 mg, 80 mg, Oral, BID, Niu, Xilin, MD  cefTRIAXone  (ROCEPHIN )  IV 2 g (06/22/24 2227)    acetaminophen , albuterol , dextromethorphan-guaiFENesin , hydrALAZINE , ondansetron  (ZOFRAN ) IV, mouth rinse, oxyCODONE -acetaminophen    Objective   Vitals:   06/22/24 1905 06/22/24 2100  06/22/24 2217 06/23/24 0336  BP: (!) 110/59 115/76 (!) 118/57 (!) 95/59  Pulse: 74 69 72 77  Resp: 18 (!) 21  17  Temp:  97.8 F (36.6 C) 98.1 F (36.7 C) 98.2 F (36.8 C)  TempSrc:  Oral    SpO2: 100% 100% 100% 97%  Weight:  112 kg    Height:  5' 5 (1.651 m)       Physical Exam Vitals and nursing note reviewed.  Constitutional:      General: She is not in acute distress.    Appearance: She is obese. She is not ill-appearing, toxic-appearing or diaphoretic.  HENT:     Head: Normocephalic and atraumatic.     Nose: Nose normal.     Mouth/Throat:     Mouth: Mucous membranes are moist.     Pharynx: Oropharynx is clear.  Eyes:     General: No scleral icterus.    Extraocular Movements: Extraocular movements intact.  Cardiovascular:     Rate and Rhythm: Normal rate and regular rhythm.     Heart sounds: Normal heart sounds. No murmur heard.    No friction rub. No gallop.  Pulmonary:     Effort: Pulmonary effort is normal. No respiratory distress.     Breath sounds: Normal breath sounds. No wheezing, rhonchi or rales.  Abdominal:     General: Bowel sounds are normal. There is no distension.     Palpations: Abdomen is soft.     Tenderness: There is no abdominal tenderness. There is no guarding or rebound.  Musculoskeletal:     Cervical back: Neck supple.     Right lower leg: Edema present.     Left lower leg: Edema present.  Skin:    General: Skin is warm and dry.     Coloration: Skin is not jaundiced or pale.  Neurological:     Mental Status: She is alert and oriented to person, place, and time. Mental status is at baseline.     Laboratory Data Recent Labs  Lab 06/22/24 1745 06/22/24 2355 06/23/24 0545  WBC 8.7 8.3 8.1  HGB 8.6* 9.2* 8.8*  HCT 26.2* 28.4* 26.8*  PLT 188 183 201   Recent Labs  Lab 06/22/24 1745  NA 137  K 4.7  CL 103  CO2 24  BUN 46*  CALCIUM  9.5  PROT 7.3  BILITOT 0.9  ALKPHOS 42  ALT 21  AST 46*  GLUCOSE 166*   Recent Labs  Lab  06/22/24 2355  INR 1.0    No results for input(s): LIPASE in the last 72 hours.      Imaging Studies: DG Knee 1-2 Views Left Result Date: 06/22/2024 CLINICAL DATA:  Left knee pain, fall EXAM: LEFT KNEE - 1-2 VIEW COMPARISON:  None FINDINGS: Normal alignment. No acute fracture or dislocation. 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 Milder degenerative changes noted within the medial and lateral compartments. No effusion. Mild prepatellar edema. Soft tissues are otherwise unremarkable. IMPRESSION: 1. Advanced patellofemoral degenerative arthritis. No acute fracture or dislocation. Electronically Signed   By: Dorethia Molt M.D.   On: 06/22/2024 22:57   CT Cervical Spine Wo Contrast Result Date: 06/22/2024 CLINICAL DATA:  Bone lesion, cervical spine, incidental EXAM: CT CERVICAL SPINE WITHOUT CONTRAST TECHNIQUE: Multidetector CT imaging of the cervical  spine was performed without intravenous contrast. Multiplanar CT image reconstructions were also generated. RADIATION DOSE REDUCTION: This exam was performed according to the departmental dose-optimization program which includes automated exposure control, adjustment of the mA and/or kV according to patient size and/or use of iterative reconstruction technique. COMPARISON:  None Available. FINDINGS: Alignment: Normal. Skull base and vertebrae: No acute fracture. No primary bone lesion or focal pathologic process. Soft tissues and spinal canal: No prevertebral fluid or swelling. No visible canal hematoma. Disc levels: Disc spaces are maintained. Mild anterior spurring in the mid and lower cervical spine. Upper chest: No acute findings Other: None IMPRESSION: No acute bony abnormality. Electronically Signed   By: Franky Crease M.D.   On: 06/22/2024 18:33   CT ABDOMEN PELVIS WO CONTRAST Result Date: 06/22/2024 CLINICAL DATA:  Abdominal pain, acute, nonlocalized EXAM: CT ABDOMEN AND PELVIS WITHOUT CONTRAST TECHNIQUE:  Multidetector CT imaging of the abdomen and pelvis was performed following the standard protocol without IV contrast. RADIATION DOSE REDUCTION: This exam was performed according to the departmental dose-optimization program which includes automated exposure control, adjustment of the mA and/or kV according to patient size and/or use of iterative reconstruction technique. COMPARISON:  None Available. FINDINGS: Lower chest: No acute findings Hepatobiliary: No focal hepatic abnormality. Gallbladder unremarkable. Pancreas: No focal abnormality or ductal dilatation. Spleen: No focal abnormality.  Normal size. Adrenals/Urinary Tract: No adrenal abnormality. No focal renal abnormality. No stones or hydronephrosis. Urinary bladder is unremarkable. Stomach/Bowel: Normal appendix. Stomach, large and small bowel grossly unremarkable. Vascular/Lymphatic: No evidence of aneurysm or adenopathy. Reproductive: No visible focal abnormality. Other: Trace free fluid in the cul-de-sac.  No free air. Musculoskeletal: No acute bony abnormality. IMPRESSION: No acute findings in the abdomen or pelvis. Electronically Signed   By: Franky Crease M.D.   On: 06/22/2024 18:31   CT HEAD WO CONTRAST ( ) Result Date: 06/22/2024 CLINICAL DATA:  Head trauma, abnormal mental status (Age 36-64y). Dizziness. EXAM: CT HEAD WITHOUT CONTRAST TECHNIQUE: Contiguous axial images were obtained from the base of the skull through the vertex without intravenous contrast. RADIATION DOSE REDUCTION: This exam was performed according to the departmental dose-optimization program which includes automated exposure control, adjustment of the mA and/or kV according to patient size and/or use of iterative reconstruction technique. COMPARISON:  None Available. FINDINGS: Brain: No acute intracranial abnormality. Specifically, no hemorrhage, hydrocephalus, mass lesion, acute infarction, or significant intracranial injury. Vascular: No hyperdense vessel or unexpected  calcification. Skull: No acute calvarial abnormality. Sinuses/Orbits: No acute findings Other: None IMPRESSION: No acute intracranial abnormality. Electronically Signed   By: Franky Crease M.D.   On: 06/22/2024 18:26    Assessment:   # Suspect UGIB - report of dark stool - acute on chronic anemia  # Acute on chronic anemia - no iron deficiency - sat 19%, ferritin >1500 - hgb base 11.2- today is 8.8 and stabilized  # Cognitive impairment - answers questions appropriately, slow verbal response - was able to speak to patient's nephew, Westley Muzzy- [phone]  # depression/anxiety  # gerd  # OSA  # Obesity  # Schizophrenia  # DM2  # CKD  Plan:  Esophagogastroduodenoscopy planned for today pending patient stability and endoscopy suite availability NPO at now Labs reviewed Protonix  40 mg iv q12 h Hold dvt ppx Monitor H&H.  Transfusion and resuscitation as per primary team Avoid frequent lab draws to prevent lab induced anemia Supportive care and antiemetics as per primary team Maintain two sites IV access Avoid nsaids Monitor for GIB.  Due to patient's cognitive delay, I called her listed contact Ms. Mabel Ruffin at the listed number.  The number was answered by a family member who reported that Ms. Ileana is very sick dealing with cancer and dementia at this time.  Ms. Vonzella son, Westley Ileana (Megyn's nephew), was able to come on the phone and was willing to help on their behalf.  I discussed the current GI issues that Ms. Chaddock is hospitalized for and the need for upper endoscopy.  We discussed the risks and benefits.  And he is consented to the procedure for the patient.  Esophagogastroduodenoscopy with possible biopsy, control of bleeding, polypectomy, and interventions as necessary has been discussed with the patient/patient representative. Informed consent was obtained from the patient/patient representative after explaining the indication, nature, and risks of  the procedure including but not limited to death, bleeding, perforation, missed neoplasm/lesions, cardiorespiratory compromise, and reaction to medications. Opportunity for questions was given and appropriate answers were provided. Patient/patient representative has verbalized understanding is amenable to undergoing the procedure.  Management of other medical comorbidities as per primary team  I personally performed the service.  Thank you for allowing us  to participate in this patient's care. Please don't hesitate to call if any questions or concerns arise.   Elspeth Ozell Jungling, DO [facility]  Portions of the record may have been created with voice recognition software. Occasional wrong-word or 'sound-a-like' substitutions may have occurred due to the inherent limitations of voice recognition software.  Read the chart carefully and recognize, using context, where substitutions may have occurred.

## 2024-06-24 ENCOUNTER — Encounter: Payer: Self-pay | Admitting: Gastroenterology

## 2024-06-24 ENCOUNTER — Encounter: Payer: Self-pay | Admitting: Anesthesiology

## 2024-06-24 ENCOUNTER — Encounter
Admission: EM | Disposition: A | Discharge status: Home / Self Care | Payer: Self-pay | Source: Home / Self Care | Attending: Internal Medicine

## 2024-06-24 DIAGNOSIS — K922 Gastrointestinal hemorrhage, unspecified: Secondary | ICD-10-CM | POA: Diagnosis not present

## 2024-06-24 LAB — GLUCOSE, CAPILLARY
Glucose-Capillary: 115 mg/dL — ABNORMAL HIGH (ref 70–99)
Glucose-Capillary: 153 mg/dL — ABNORMAL HIGH (ref 70–99)

## 2024-06-24 LAB — BASIC METABOLIC PANEL WITH GFR
Anion gap: 13 (ref 5–15)
BUN: 24 mg/dL — ABNORMAL HIGH (ref 6–20)
CO2: 25 mmol/L (ref 22–32)
Calcium: 9.5 mg/dL (ref 8.9–10.3)
Chloride: 104 mmol/L (ref 98–111)
Creatinine, Ser: 1.66 mg/dL — ABNORMAL HIGH (ref 0.44–1.00)
GFR, Estimated: 37 mL/min — ABNORMAL LOW (ref >60)
Glucose, Bld: 92 mg/dL (ref 70–99)
Potassium: 3.8 mmol/L (ref 3.5–5.1)
Sodium: 142 mmol/L (ref 135–145)

## 2024-06-24 LAB — CBC
HCT: 26.6 % — ABNORMAL LOW (ref 36.0–46.0)
Hemoglobin: 9 g/dL — ABNORMAL LOW (ref 12.0–15.0)
MCH: 31.7 pg (ref 26.0–34.0)
MCHC: 33.8 g/dL (ref 30.0–36.0)
MCV: 93.7 fL (ref 80.0–100.0)
Platelets: 214 K/uL (ref 150–400)
RBC: 2.84 MIL/uL — ABNORMAL LOW (ref 3.87–5.11)
RDW: 11.9 % (ref 11.5–15.5)
WBC: 10.8 K/uL — ABNORMAL HIGH (ref 4.0–10.5)
nRBC: 0.2 % (ref 0.0–0.2)

## 2024-06-24 SURGERY — COLONOSCOPY
Anesthesia: General

## 2024-06-24 MED ORDER — FERROUS SULFATE 324 (65 FE) MG PO TBEC: 324.0000 mg | DELAYED_RELEASE_TABLET | Freq: Every day | ORAL | Status: AC

## 2024-06-24 MED ORDER — FLUCONAZOLE 150 MG PO TABS: 150.0000 mg | ORAL_TABLET | Freq: Every day | ORAL | 0 refills | Status: AC

## 2024-06-24 MED ORDER — CIPROFLOXACIN HCL 500 MG PO TABS: 500.0000 mg | ORAL_TABLET | Freq: Two times a day (BID) | ORAL | 0 refills | Status: AC

## 2024-06-24 MED ORDER — PANTOPRAZOLE SODIUM 40 MG PO TBEC: 40.0000 mg | DELAYED_RELEASE_TABLET | Freq: Every day | ORAL | 11 refills | Status: AC

## 2024-06-24 NOTE — Anesthesia Preprocedure Evaluation (Signed)
 Anesthesia Evaluation  Patient identified by MRN, date of birth, ID band Patient awake    Reviewed: Allergy & Precautions, NPO status , Patient's Chart, lab work & pertinent test results  History of Anesthesia Complications Negative for: history of anesthetic complications  Airway Mallampati: III  TM Distance: >3 FB Neck ROM: full    Dental  (+) Chipped   Pulmonary asthma , sleep apnea    Pulmonary exam normal        Cardiovascular Exercise Tolerance: Good hypertension, (-) angina Normal cardiovascular exam     Neuro/Psych  PSYCHIATRIC DISORDERS      negative neurological ROS     GI/Hepatic Neg liver ROS,GERD  Controlled,,  Endo/Other  diabetesHypothyroidism    Renal/GU Renal InsufficiencyRenal disease  negative genitourinary   Musculoskeletal   Abdominal   Peds  Hematology  (+) Blood dyscrasia, anemia   Anesthesia Other Findings Past Medical History: No date: Asthma No date: Diabetes mellitus without complication (HCC) No date: Hyperlipemia No date: Hypertension No date: Hypothyroidism No date: Schizophrenia (HCC) No date: Sleep apnea  Past Surgical History: No date: Denies surgical history  BMI    Body Mass Index: 41.09 kg/m      Reproductive/Obstetrics negative OB ROS                              Anesthesia Physical Anesthesia Plan  ASA: 3  Anesthesia Plan: General   Post-op Pain Management:    Induction: Intravenous  PONV Risk Score and Plan: Propofol  infusion and TIVA  Airway Management Planned: Natural Airway and Nasal Cannula  Additional Equipment:   Intra-op Plan:   Post-operative Plan:   Informed Consent:      Dental Advisory Given  Plan Discussed with: Anesthesiologist, CRNA and Surgeon  Anesthesia Plan Comments:          Anesthesia Quick Evaluation

## 2024-06-24 NOTE — OR Nursing (Signed)
 Talked to patients nurse Medford Hillier RN [id number] decision to refuse procedure. She only drank half the prep and was not clear of stool. Dr Jinny has been notified.

## 2024-06-24 NOTE — Discharge Summary (Signed)
 Physician Discharge Summary  Mackenzie Randall FMW:[id number] DOB: June 02, 1971 DOA: 06/22/2024  PCP: [facility]  Admit date: 06/22/2024 Discharge date: 06/24/2024  Admitted From: Home Disposition:  Home (group home)  Recommendations for Outpatient Follow-up:  Follow up with PCP in 1-2 weeks   Home Health:No  Equipment/Devices:None   Discharge Condition:Stable  CODE STATUS:FULL  Diet recommendation: Carb mod  Brief/Interim Summary:  53 y.o. female with medical history significant of schizophrenia, depression with anxiety, HTN, HLD, DM, astham, bilateral leg edema (possible CHF?,  on Lasix, no 2D echo on record), GERD, mild dementia, CKD-IV, morbid obesity, OSA on CPAP, anemia, who presents with dizziness, dark stool.   Per report, pt was last known normal around 1 PM.  He started feeling woozy and dizzy in the afternoon.  She did not fall today, but states that she fell 1 week ago.  No L LOC.  She hit her head and injured her left knee.  She still has some pain in left knee.  She states that she had 1 episode of dark stool today. She had nausea and vomited once earlier, denies abdominal pain to me.  No diarrhea.  No fever or chills.  Denies chest pain, cough, SOB.  She was recently treated with Macrobid for UTI.  Today she denies symptoms of UTI.  No fever or chills. Per EMS report, patient had low blood pressure.  Her blood pressure is 91/53 in ED which improved to 110/59 after giving 500 cc normal saline x 2 dose.   Of note, pt was seen by OB/GYN for follow-up visit due to perimenopausal vasomotor symptoms on 06/20/24. Pt was started on progesterone  capsule and estradiol  patch.  Patient was given 1 dose of Diflucan  for candidal vaginitis.  Patient was also started on fluconazole  for tinea cruris.  Today patient denies vaginal discharge and vaginal itchiness.  No vaginal bleeding.  S/p EGD.  No bleeding.  Was set up for c scope, but refused to complete prep.  Understands that  she needs to return to ED if she has any evidence of dark or bloody stools.  Hb stable on discharge.      Discharge Diagnoses:  Principal Problem:   GI bleeding Active Problems:   Acute blood loss anemia   UTI (urinary tract infection)   Bilateral leg edema   Hypertension   HLD (hyperlipidemia)   CKD (chronic kidney disease) stage 4, GFR 15-29 ml/min (HCC)   Type II diabetes mellitus with renal manifestations (HCC)   GERD (gastroesophageal reflux disease)   Asthma   Fall at home, initial encounter   Schizophrenia (HCC)   Tinea cruris   Depression with anxiety   Perimenopausal vasomotor symptoms   Obesity, Class III, BMI 40-49.9 (morbid obesity)  Acute on chronic : pt reports 1 episode of dark stool BM. Hemoglobin 8.6 (11.2 on 06/13/24).Recovered to 9.0 on discharge without intervention.  EGD unrevealing.  C scope ordered, but patient refused Plan: Return to group home STOP MOBIC on discharge Continue ASA Increase PPI to 40mg  daily Can continue ASA 81 daily Return to ED for bleeding/dark stools FU OP PCP     UTI (urinary tract infection) Stable 3 day antibiotic course prescribed 3 day fluconazole  course prescribed      Discharge Instructions  Discharge Instructions     Diet - low sodium heart healthy   Complete by: As directed    Increase activity slowly   Complete by: As directed       Allergies as  of 06/24/2024   No Known Allergies      Medication List     STOP taking these medications    nystatin  powder Commonly known as: MYCOSTATIN /NYSTOP        TAKE these medications    Accu-Chek Guide Test test strip Generic drug: glucose blood   Accu-Chek Softclix Lancets lancets SMARTSIG:Topical   albuterol  108 (90 Base) MCG/ACT inhaler Commonly known as: VENTOLIN  HFA Inhale 2 puffs into the lungs every 6 (six) hours as needed.   amLODipine 10 MG tablet Commonly known as: NORVASC Take 10 mg by mouth.   aspirin EC 81 MG tablet Take 81 mg by  mouth.   buPROPion  150 MG 24 hr tablet Commonly known as: WELLBUTRIN  XL Take 150 mg by mouth daily.   carvedilol 25 MG tablet Commonly known as: COREG Take 25 mg by mouth daily.   cetirizine 10 MG tablet Commonly known as: ZYRTEC Take 10 mg by mouth daily.   ciprofloxacin  500 MG tablet Commonly known as: Cipro  Take 1 tablet (500 mg total) by mouth 2 (two) times daily for 2 days.   clobetasol  ointment 0.05 % Commonly known as: TEMOVATE  Apply once a day for 4 weeks, then every other day for 4 weeks, then once a week as needed.   cloNIDine 0.1 MG tablet Commonly known as: CATAPRES Take 0.1 mg by mouth daily.   clotrimazole  1 % cream Commonly known as: LOTRIMIN  Apply topically 2 (two) times daily.   colchicine  0.6 MG tablet Take 0.6 mg by mouth.   desvenlafaxine 100 MG 24 hr tablet Commonly known as: PRISTIQ Take 100 mg by mouth daily.   divalproex  500 MG 24 hr tablet Commonly known as: DEPAKOTE  ER Take 500 mg by mouth 3 (three) times daily.   estradiol  0.025 mg/24hr patch Commonly known as: CLIMARA  - Dosed in mg/24 hr Place 1 patch (0.025 mg total) onto the skin once a week.   etonogestrel  68 MG Impl implant Commonly known as: NEXPLANON  1 each by Subdermal route once.   Farxiga 10 MG Tabs tablet Generic drug: dapagliflozin propanediol Take 10 mg by mouth daily.   ferrous sulfate  324 (65 Fe) MG Tbec Take 324 mg by mouth.   fluconazole  150 MG tablet Commonly known as: DIFLUCAN  Take 1 tablet (150 mg total) by mouth daily for 2 days. Start taking on: June 25, 2024   fluticasone 50 MCG/ACT nasal spray Commonly known as: FLONASE Place into both nostrils.   fluticasone-salmeterol 250-50 MCG/ACT Aepb Commonly known as: ADVAIR Inhale 1 puff into the lungs.   furosemide 20 MG tablet Commonly known as: LASIX Take 20 mg by mouth daily.   gabapentin 100 MG capsule Commonly known as: NEURONTIN Take by mouth.   hydrochlorothiazide 25 MG tablet Commonly  known as: HYDRODIURIL Take 25 mg by mouth daily.   Jardiance 25 MG Tabs tablet Generic drug: empagliflozin Take 25 mg by mouth daily.   Kerendia  20 MG Tabs Generic drug: Finerenone  Take by mouth.   ketoconazole  2 % cream Commonly known as: NIZORAL  Apply 1 Application topically daily. For 4 wks   Klor-Con M20 20 MEQ tablet Generic drug: potassium chloride SA Take by mouth.   Lantus  SoloStar 100 UNIT/ML Solostar Pen Generic drug: insulin  glargine Inject into the skin.   levothyroxine  100 MCG tablet Commonly known as: SYNTHROID  Take 100 mcg by mouth daily.   lisinopril 40 MG tablet Commonly known as: ZESTRIL Take 40 mg by mouth daily.   meloxicam 15 MG tablet Commonly known as: MOBIC  Take 15 mg by mouth daily.   methylphenidate  10 MG tablet Commonly known as: RITALIN  Take 10 mg by mouth daily.   metolazone 2.5 MG tablet Commonly known as: ZAROXOLYN Take 2.5 mg by mouth daily.   nitrofurantoin (macrocrystal-monohydrate) 100 MG capsule Commonly known as: MACROBID Take 100 mg by mouth 2 (two) times daily.   omeprazole 20 MG capsule Commonly known as: PRILOSEC Take 20 mg by mouth daily.   pantoprazole  40 MG tablet Commonly known as: PROTONIX  Take 1 tablet (40 mg total) by mouth daily. What changed:  medication strength how much to take when to take this   pravastatin 40 MG tablet Commonly known as: PRAVACHOL Take 40 mg by mouth daily.   progesterone  100 MG capsule Commonly known as: Prometrium  Take 1 capsule (100 mg total) by mouth daily.   QUEtiapine  50 MG tablet Commonly known as: SEROQUEL  Take by mouth.   rosuvastatin  40 MG tablet Commonly known as: CRESTOR  Take 40 mg by mouth daily.   sitaGLIPtin 100 MG tablet Commonly known as: JANUVIA Take 100 mg by mouth daily.   Stiolto Respimat 2.5-2.5 MCG/ACT Aers Generic drug: Tiotropium Bromide-Olodaterol SMARTSIG:2 Puff(s) Via Inhaler Daily   Sure Comfort Pen Needles 31G X 5 MM Misc Generic  drug: Insulin  Pen Needle   traZODone 100 MG tablet Commonly known as: DESYREL Take 100 mg by mouth at bedtime.   Trulicity 1.5 MG/0.5ML Soaj Generic drug: Dulaglutide Inject into the skin.   ziprasidone  80 MG capsule Commonly known as: GEODON  Take 80 mg by mouth 2 (two) times daily.        No Known Allergies  Consultations: GI   Procedures/Studies: DG Knee 1-2 Views Left Result Date: 06/22/2024 CLINICAL DATA:  Left knee pain, fall EXAM: LEFT KNEE - 1-2 VIEW COMPARISON:  None FINDINGS: Normal alignment. No acute fracture or dislocation. 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 Milder degenerative changes noted within the medial and lateral compartments. No effusion. Mild prepatellar edema. Soft tissues are otherwise unremarkable. IMPRESSION: 1. Advanced patellofemoral degenerative arthritis. No acute fracture or dislocation. Electronically Signed   By: Dorethia Molt M.D.   On: 06/22/2024 22:57   CT Cervical Spine Wo Contrast Result Date: 06/22/2024 CLINICAL DATA:  Bone lesion, cervical spine, incidental EXAM: CT CERVICAL SPINE WITHOUT CONTRAST TECHNIQUE: Multidetector CT imaging of the cervical spine was performed without intravenous contrast. Multiplanar CT image reconstructions were also generated. RADIATION DOSE REDUCTION: This exam was performed according to the departmental dose-optimization program which includes automated exposure control, adjustment of the mA and/or kV according to patient size and/or use of iterative reconstruction technique. COMPARISON:  None Available. FINDINGS: Alignment: Normal. Skull base and vertebrae: No acute fracture. No primary bone lesion or focal pathologic process. Soft tissues and spinal canal: No prevertebral fluid or swelling. No visible canal hematoma. Disc levels: Disc spaces are maintained. Mild anterior spurring in the mid and lower cervical spine. Upper chest: No acute findings Other: None IMPRESSION: No  acute bony abnormality. Electronically Signed   By: Franky Crease M.D.   On: 06/22/2024 18:33   CT ABDOMEN PELVIS WO CONTRAST Result Date: 06/22/2024 CLINICAL DATA:  Abdominal pain, acute, nonlocalized EXAM: CT ABDOMEN AND PELVIS WITHOUT CONTRAST TECHNIQUE: Multidetector CT imaging of the abdomen and pelvis was performed following the standard protocol without IV contrast. RADIATION DOSE REDUCTION: This exam was performed according to the departmental dose-optimization program which includes automated exposure control, adjustment of the mA and/or kV according to patient size and/or use of iterative reconstruction  technique. COMPARISON:  None Available. FINDINGS: Lower chest: No acute findings Hepatobiliary: No focal hepatic abnormality. Gallbladder unremarkable. Pancreas: No focal abnormality or ductal dilatation. Spleen: No focal abnormality.  Normal size. Adrenals/Urinary Tract: No adrenal abnormality. No focal renal abnormality. No stones or hydronephrosis. Urinary bladder is unremarkable. Stomach/Bowel: Normal appendix. Stomach, large and small bowel grossly unremarkable. Vascular/Lymphatic: No evidence of aneurysm or adenopathy. Reproductive: No visible focal abnormality. Other: Trace free fluid in the cul-de-sac.  No free air. Musculoskeletal: No acute bony abnormality. IMPRESSION: No acute findings in the abdomen or pelvis. Electronically Signed   By: Franky Crease M.D.   On: 06/22/2024 18:31   CT HEAD WO CONTRAST ( ) Result Date: 06/22/2024 CLINICAL DATA:  Head trauma, abnormal mental status (Age 5-64y). Dizziness. EXAM: CT HEAD WITHOUT CONTRAST TECHNIQUE: Contiguous axial images were obtained from the base of the skull through the vertex without intravenous contrast. RADIATION DOSE REDUCTION: This exam was performed according to the departmental dose-optimization program which includes automated exposure control, adjustment of the mA and/or kV according to patient size and/or use of iterative  reconstruction technique. COMPARISON:  None Available. FINDINGS: Brain: No acute intracranial abnormality. Specifically, no hemorrhage, hydrocephalus, mass lesion, acute infarction, or significant intracranial injury. Vascular: No hyperdense vessel or unexpected calcification. Skull: No acute calvarial abnormality. Sinuses/Orbits: No acute findings Other: None IMPRESSION: No acute intracranial abnormality. Electronically Signed   By: Franky Crease M.D.   On: 06/22/2024 18:26      Subjective: Seen and examined on day of discharge.  Stable, appropriate for return to group home.  Discharge Exam: Vitals:   06/24/24 0459 06/24/24 0820  BP: 122/64 110/74  Pulse: 77 81  Resp: 16 16  Temp: 98.5 F (36.9 C) 98.2 F (36.8 C)  SpO2: 98% 100%   Vitals:   06/23/24 1600 06/23/24 2040 06/24/24 0459 06/24/24 0820  BP: 130/60 109/62 122/64 110/74  Pulse: 86 80 77 81  Resp:  16 16 16   Temp: 98.4 F (36.9 C) 98.2 F (36.8 C) 98.5 F (36.9 C) 98.2 F (36.8 C)  TempSrc: Oral Oral    SpO2: 98% 100% 98% 100%  Weight:      Height:        General: Pt is alert, awake, not in acute distress Cardiovascular: RRR, S1/S2 +, no rubs, no gallops Respiratory: CTA bilaterally, no wheezing, no rhonchi Abdominal: Soft, NT, ND, bowel sounds + Extremities: no edema, no cyanosis    The results of significant diagnostics from this hospitalization (including imaging, microbiology, ancillary and laboratory) are listed below for reference.     Microbiology: Recent Results (from the past 240 hours)  Urine Culture     Status: None (Preliminary result)   Collection Time: 06/22/24  7:07 PM   Specimen: Urine, Clean Catch  Result Value Ref Range Status   Specimen Description   Final    URINE, CLEAN CATCH Performed at [facility], [address] [place], [place] [address]    Special Requests   Final    NONE Performed at [facility], [address] [place], [place] [address]    Culture    Final    CULTURE REINCUBATED FOR BETTER GROWTH Performed at [facility], [address] [place], [place] [address]    Report Status PENDING  Incomplete     Labs: BNP (last 3 results) Recent Labs    06/22/24 2355  BNP 20.0   Basic Metabolic Panel: Recent Labs  Lab 06/22/24 1745 06/23/24 1007  06/24/24 0254  NA 137 139 142  K 4.7 4.4 3.8  CL 103 102 104  CO2 24 25 25   GLUCOSE 166* 124* 92  BUN 46* 34* 24*  CREATININE 2.92* 2.23* 1.66*  CALCIUM  9.5 9.5 9.5   Liver Function Tests: Recent Labs  Lab 06/22/24 1745  AST 46*  ALT 21  ALKPHOS 42  BILITOT 0.9  PROT 7.3  ALBUMIN 3.6   No results for input(s): LIPASE, AMYLASE in the last 168 hours. No results for input(s): AMMONIA in the last 168 hours. CBC: Recent Labs  Lab 06/22/24 1745 06/22/24 2355 06/23/24 0545 06/24/24 0254  WBC 8.7 8.3 8.1 10.8*  NEUTROABS 3.5  --   --   --   HGB 8.6* 9.2* 8.8* 9.0*  HCT 26.2* 28.4* 26.8* 26.6*  MCV 96.3 96.3 95.4 93.7  PLT 188 183 201 214   Cardiac Enzymes: Recent Labs  Lab 06/22/24 2355  CKTOTAL 1,136*   BNP: Invalid input(s): POCBNP CBG: Recent Labs  Lab 06/23/24 0850 06/23/24 1222 06/23/24 1703 06/23/24 2128 06/24/24 0823  GLUCAP 128* 110* 84 140* 115*   D-Dimer No results for input(s): DDIMER in the last 72 hours. Hgb A1c No results for input(s): HGBA1C in the last 72 hours. Lipid Profile No results for input(s): CHOL, HDL, LDLCALC, TRIG, CHOLHDL, LDLDIRECT in the last 72 hours. Thyroid  function studies No results for input(s): TSH, T4TOTAL, T3FREE, THYROIDAB in the last 72 hours.  Invalid input(s): FREET3 Anemia work up Recent Labs    06/22/24 2355  VITAMINB12 4,285*  FOLATE 12.5  FERRITIN 1,541*  TIBC 403  IRON 76  RETICCTPCT 1.9   Urinalysis    Component Value Date/Time   COLORURINE YELLOW (A) 06/22/2024 1907   APPEARANCEUR HAZY (A) 06/22/2024 1907   LABSPEC 1.016 06/22/2024 1907   PHURINE  5.0 06/22/2024 1907   GLUCOSEU >=500 (A) 06/22/2024 1907   HGBUR NEGATIVE 06/22/2024 1907   BILIRUBINUR NEGATIVE 06/22/2024 1907   KETONESUR NEGATIVE 06/22/2024 1907   PROTEINUR NEGATIVE 06/22/2024 1907   NITRITE NEGATIVE 06/22/2024 1907   LEUKOCYTESUR TRACE (A) 06/22/2024 1907   Sepsis Labs Recent Labs  Lab 06/22/24 1745 06/22/24 2355 06/23/24 0545 06/24/24 0254  WBC 8.7 8.3 8.1 10.8*   Microbiology Recent Results (from the past 240 hours)  Urine Culture     Status: None (Preliminary result)   Collection Time: 06/22/24  7:07 PM   Specimen: Urine, Clean Catch  Result Value Ref Range Status   Specimen Description   Final    URINE, CLEAN CATCH Performed at [facility], [address], [place], [place] [address]    Special Requests   Final    NONE Performed at [facility], [address], [place], [place] [address]    Culture   Final    CULTURE REINCUBATED FOR BETTER GROWTH Performed at [facility], [address] [place], [place] [address]    Report Status PENDING  Incomplete     Time coordinating discharge: 45 minutes   SIGNED:   Calvin KATHEE Robson, MD  [facility] 06/24/2024, 10:59 AM Pager   If 7PM-7AM, please contact night-coverage

## 2024-06-24 NOTE — Progress Notes (Signed)
 PROGRESS NOTE    Mackenzie Randall  FMW:[id number] DOB: 10-19-1971 DOA: 06/22/2024 PCP: [employer]    Brief Narrative:   53 y.o. female with medical history significant of schizophrenia, depression with anxiety, HTN, HLD, DM, astham, bilateral leg edema (possible CHF?,  on Lasix, no 2D echo on record), GERD, mild dementia, CKD-IV, morbid obesity, OSA on CPAP, anemia, who presents with dizziness, dark stool.   Per report, pt was last known normal around 1 PM.  He started feeling woozy and dizzy in the afternoon.  She did not fall today, but states that she fell 1 week ago.  No L LOC.  She hit her head and injured her left knee.  She still has some pain in left knee.  She states that she had 1 episode of dark stool today. She had nausea and vomited once earlier, denies abdominal pain to me.  No diarrhea.  No fever or chills.  Denies chest pain, cough, SOB.  She was recently treated with Macrobid for UTI.  Today she denies symptoms of UTI.  No fever or chills. Per EMS report, patient had low blood pressure.  Her blood pressure is 91/53 in ED which improved to 110/59 after giving 500 cc normal saline x 2 dose.   Of note, pt was seen by OB/GYN for follow-up visit due to perimenopausal vasomotor symptoms on 06/20/24. Pt was started on progesterone  capsule and estradiol  patch.  Patient was given 1 dose of Diflucan  for candidal vaginitis.  Patient was also started on fluconazole  for tinea cruris.  Today patient denies vaginal discharge and vaginal itchiness.  No vaginal bleeding.   Assessment & Plan:   Principal Problem:   GI bleeding Active Problems:   Acute blood loss anemia   UTI (urinary tract infection)   Bilateral leg edema   Hypertension   HLD (hyperlipidemia)   CKD (chronic kidney disease) stage 4, GFR 15-29 ml/min (HCC)   Type II diabetes mellitus with renal manifestations (HCC)   GERD (gastroesophageal reflux disease)   Asthma   Fall at home, initial encounter    Schizophrenia (HCC)   Tinea cruris   Depression with anxiety   Perimenopausal vasomotor symptoms   Obesity, Class III, BMI 40-49.9 (morbid obesity)  GI bleeding and acute blood loss anemia: pt reports 1 episode of dark stool BM. Hemoglobin 8.6 (11.2 on 06/13/24).  GI consulted Plan: EGD No indication for transfusion IV PPI twice daily Hold aspirin and Mobic  UTI (urinary tract infection) - Rocephin  - Follow-up urine culture   Bilateral leg edema: Possibly due to CHF.  Patient is Lasix and metolazone.  No 2D echo on record. - Lasix on hold   Hypertension: Blood pressure soft -Hold all blood pressure medications and diuretics, including HCTZ, amlodipine, Coreg, clonidine, lisinopril, Lasix, metolazone - IV hydralazine  as needed   HLD (hyperlipidemia) -Crestor    CKD (chronic kidney disease) stage 4, GFR 15-29 ml/min (HCC): Renal function slightly worsening.  Recent baseline creatinine 2.26 on 06/13/2024.  Her creatinine is 2.92, BUN 46, GFR 19.  Likely due to UTI and continuation of diuretics and lisinopril. -Lisinopril and diuretics are on hold. - Treating UTI as above - IV fluid as above   Type II diabetes mellitus with renal manifestations [facility]): Patient is taking Lantus  25 units daily, Farxiga, Januvia, -SSI - Currently insulin  15 units daily   GERD (gastroesophageal reflux disease) - IV Protonix    Asthma: Stable -Bronchodilators and as needed Mucinex    Fall at home, initial encounter -  Fall precaution - Follow-up x-ray of left knee   Schizophrenia (HCC) and Depression with anxiety: Patient is calm now. - Continue home medications:   Obesity, Class III, BMI 40-49.9 (morbid obesity): Patient has Obesity Class III, with body weight  Kg and BMI  kg/m2.  - Encourage losing weight - Exercise and healthy diet    Tinea cruris: fluconazole  (DIFLUCAN ) 150 MG tablet, ketoconazole  (NIZORAL ) 2 % cream,    Perimenopausal vasomotor symptoms: per Ob/Gyn PA, Alicia Copland's note  on 06/20/24. Plan: progesterone  (PROMETRIUM ) 100 MG capsule, estradiol  (CLIMARA  - DOSED IN MG/24 HR) 0.025 mg/24hr patch; Rx RF climara , add prometrium  due to expired nexplanon . Rxs eRxd. F/u at nexplanon  rem.   DVT prophylaxis: SCD Code Status: Full Family Communication: None Disposition Plan: Status is: Inpatient Remains inpatient appropriate because: Possible GI bleed   Level of care: Telemetry Medical  Consultants:  GI  Procedures:  EGD  Antimicrobials: Rocephin    Subjective: Seen and examined.  No acute events overnight.  No new complaints.  Objective: Vitals:   06/23/24 1600 06/23/24 2040 06/24/24 0459 06/24/24 0820  BP: 130/60 109/62 122/64 110/74  Pulse: 86 80 77 81  Resp:  16 16 16   Temp: 98.4 F (36.9 C) 98.2 F (36.8 C) 98.5 F (36.9 C) 98.2 F (36.8 C)  TempSrc: Oral Oral    SpO2: 98% 100% 98% 100%  Weight:      Height:       No intake or output data in the 24 hours ending 06/24/24 1306 Filed Weights   06/22/24 1740 06/22/24 2100  Weight: 112 kg 112 kg    Examination:  General exam: Appears calm and comfortable  Respiratory system: Clear to auscultation. Respiratory effort normal. Cardiovascular system: 1 S2, RRR, no murmurs, no pedal edema Gastrointestinal system: Soft, T/ND, normal bowel sounds Central nervous system: Alert and oriented. No focal neurological deficits. Extremities: Symmetric 5 x 5 power. Skin: No rashes, lesions or ulcers Psychiatry: Judgement and insight appear impaired. Mood & affect flattened.     Data Reviewed: I have personally reviewed following labs and imaging studies  CBC: Recent Labs  Lab 06/22/24 1745 06/22/24 2355 06/23/24 0545 06/24/24 0254  WBC 8.7 8.3 8.1 10.8*  NEUTROABS 3.5  --   --   --   HGB 8.6* 9.2* 8.8* 9.0*  HCT 26.2* 28.4* 26.8* 26.6*  MCV 96.3 96.3 95.4 93.7  PLT 188 183 201 214   Basic Metabolic Panel: Recent Labs  Lab 06/22/24 1745 06/23/24 1007 06/24/24 0254  NA 137 139 142   K 4.7 4.4 3.8  CL 103 102 104  CO2 24 25 25   GLUCOSE 166* 124* 92  BUN 46* 34* 24*  CREATININE 2.92* 2.23* 1.66*  CALCIUM  9.5 9.5 9.5   GFR: Estimated Creatinine Clearance: 48.9 mL/min (A) (by C-G formula based on SCr of 1.66 mg/dL (H)). Liver Function Tests: Recent Labs  Lab 06/22/24 1745  AST 46*  ALT 21  ALKPHOS 42  BILITOT 0.9  PROT 7.3  ALBUMIN 3.6   No results for input(s): LIPASE, AMYLASE in the last 168 hours. No results for input(s): AMMONIA in the last 168 hours. Coagulation Profile: Recent Labs  Lab 06/22/24 2355  INR 1.0   Cardiac Enzymes: Recent Labs  Lab 06/22/24 2355  CKTOTAL 1,136*   BNP (last 3 results) No results for input(s): PROBNP in the last 8760 hours. HbA1C: No results for input(s): HGBA1C in the last 72 hours. CBG: Recent Labs  Lab 06/23/24 1222 06/23/24 1703  06/23/24 2128 06/24/24 0823 06/24/24 1119  GLUCAP 110* 84 140* 115* 153*   Lipid Profile: No results for input(s): CHOL, HDL, LDLCALC, TRIG, CHOLHDL, LDLDIRECT in the last 72 hours. Thyroid  Function Tests: No results for input(s): TSH, T4TOTAL, FREET4, T3FREE, THYROIDAB in the last 72 hours. Anemia Panel: Recent Labs    06/22/24 2355  VITAMINB12 4,285*  FOLATE 12.5  FERRITIN 1,541*  TIBC 403  IRON 76  RETICCTPCT 1.9   Sepsis Labs: Recent Labs  Lab 06/22/24 1749  LATICACIDVEN 0.9    Recent Results (from the past 240 hours)  Urine Culture     Status: Abnormal (Preliminary result)   Collection Time: 06/22/24  7:07 PM   Specimen: Urine, Clean Catch  Result Value Ref Range Status   Specimen Description   Final    URINE, CLEAN CATCH Performed at [facility], [address] [place], [place] [address]    Special Requests   Final    NONE Performed at [facility], [address], [place], [place] [address]    Culture (A)  Final    10,000 COLONIES/mL GRAM NEGATIVE RODS 10,000 COLONIES/mL ESCHERICHIA  COLI 10,000 COLONIES/mL STAPHYLOCOCCUS AUREUS SUSCEPTIBILITIES TO FOLLOW Performed at [facility], [address], [place], [place] [address]    Report Status PENDING  Incomplete         Radiology Studies: DG Knee 1-2 Views Left Result Date: 06/22/2024 CLINICAL DATA:  Left knee pain, fall EXAM: LEFT KNEE - 1-2 VIEW COMPARISON:  None FINDINGS: Normal alignment. No acute fracture or dislocation. 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 Milder degenerative changes noted within the medial and lateral compartments. No effusion. Mild prepatellar edema. Soft tissues are otherwise unremarkable. IMPRESSION: 1. Advanced patellofemoral degenerative arthritis. No acute fracture or dislocation. Electronically Signed   By: Dorethia Molt M.D.   On: 06/22/2024 22:57   CT Cervical Spine Wo Contrast Result Date: 06/22/2024 CLINICAL DATA:  Bone lesion, cervical spine, incidental EXAM: CT CERVICAL SPINE WITHOUT CONTRAST TECHNIQUE: Multidetector CT imaging of the cervical spine was performed without intravenous contrast. Multiplanar CT image reconstructions were also generated. RADIATION DOSE REDUCTION: This exam was performed according to the departmental dose-optimization program which includes automated exposure control, adjustment of the mA and/or kV according to patient size and/or use of iterative reconstruction technique. COMPARISON:  None Available. FINDINGS: Alignment: Normal. Skull base and vertebrae: No acute fracture. No primary bone lesion or focal pathologic process. Soft tissues and spinal canal: No prevertebral fluid or swelling. No visible canal hematoma. Disc levels: Disc spaces are maintained. Mild anterior spurring in the mid and lower cervical spine. Upper chest: No acute findings Other: None IMPRESSION: No acute bony abnormality. Electronically Signed   By: Franky Crease M.D.   On: 06/22/2024 18:33   CT ABDOMEN PELVIS WO CONTRAST Result Date:  06/22/2024 CLINICAL DATA:  Abdominal pain, acute, nonlocalized EXAM: CT ABDOMEN AND PELVIS WITHOUT CONTRAST TECHNIQUE: Multidetector CT imaging of the abdomen and pelvis was performed following the standard protocol without IV contrast. RADIATION DOSE REDUCTION: This exam was performed according to the departmental dose-optimization program which includes automated exposure control, adjustment of the mA and/or kV according to patient size and/or use of iterative reconstruction technique. COMPARISON:  None Available. FINDINGS: Lower chest: No acute findings Hepatobiliary: No focal hepatic abnormality. Gallbladder unremarkable. Pancreas: No focal abnormality or ductal dilatation. Spleen: No focal abnormality.  Normal size. Adrenals/Urinary Tract: No adrenal abnormality. No focal renal abnormality. No stones or hydronephrosis. Urinary bladder  is unremarkable. Stomach/Bowel: Normal appendix. Stomach, large and small bowel grossly unremarkable. Vascular/Lymphatic: No evidence of aneurysm or adenopathy. Reproductive: No visible focal abnormality. Other: Trace free fluid in the cul-de-sac.  No free air. Musculoskeletal: No acute bony abnormality. IMPRESSION: No acute findings in the abdomen or pelvis. Electronically Signed   By: Franky Crease M.D.   On: 06/22/2024 18:31   CT HEAD WO CONTRAST ( ) Result Date: 06/22/2024 CLINICAL DATA:  Head trauma, abnormal mental status (Age 63-64y). Dizziness. EXAM: CT HEAD WITHOUT CONTRAST TECHNIQUE: Contiguous axial images were obtained from the base of the skull through the vertex without intravenous contrast. RADIATION DOSE REDUCTION: This exam was performed according to the departmental dose-optimization program which includes automated exposure control, adjustment of the mA and/or kV according to patient size and/or use of iterative reconstruction technique. COMPARISON:  None Available. FINDINGS: Brain: No acute intracranial abnormality. Specifically, no hemorrhage,  hydrocephalus, mass lesion, acute infarction, or significant intracranial injury. Vascular: No hyperdense vessel or unexpected calcification. Skull: No acute calvarial abnormality. Sinuses/Orbits: No acute findings Other: None IMPRESSION: No acute intracranial abnormality. Electronically Signed   By: Franky Crease M.D.   On: 06/22/2024 18:26        Scheduled Meds:  buPROPion   150 mg Oral Daily   clotrimazole    Topical BID   colchicine   0.6 mg Oral Daily   divalproex   500 mg Oral TID   estradiol   0.05 mg Transdermal Weekly   ferrous sulfate   325 mg Oral Q1400   fluconazole   150 mg Oral Daily   insulin  aspart  0-5 Units Subcutaneous QHS   insulin  aspart  0-9 Units Subcutaneous TID WC   insulin  glargine-yfgn  15 Units Subcutaneous QHS   ketoconazole   1 Application Topical Daily   levothyroxine   100 mcg Oral Q0600   loratadine   10 mg Oral Daily   methylphenidate   10 mg Oral Daily   pantoprazole  (PROTONIX ) IV  40 mg Intravenous Q12H   progesterone   100 mg Oral Daily   QUEtiapine   50 mg Oral Daily   rosuvastatin   40 mg Oral Daily   venlafaxine  XR  150 mg Oral Q breakfast   ziprasidone   80 mg Oral BID   Continuous Infusions:  cefTRIAXone  (ROCEPHIN )  IV 2 g (06/23/24 2128)     LOS: 2 days     Calvin KATHEE Robson, MD [facility]   If 7PM-7AM, please contact night-coverage  06/24/2024, 1:06 PM

## 2024-06-25 LAB — URINE CULTURE: Culture: 10000 — AB

## 2024-06-25 LAB — SURGICAL PATHOLOGY

## 2024-06-26 LAB — CYTOLOGY - PAP
Comment: NEGATIVE
Diagnosis: NEGATIVE
High risk HPV: NEGATIVE

## 2024-07-18 ENCOUNTER — Encounter: Payer: Self-pay | Admitting: Obstetrics and Gynecology

## 2024-07-18 ENCOUNTER — Ambulatory Visit (INDEPENDENT_AMBULATORY_CARE_PROVIDER_SITE_OTHER): Payer: MEDICAID | Admitting: Obstetrics and Gynecology

## 2024-07-18 VITALS — BP 83/53 | HR 79 | Wt 243.0 lb

## 2024-07-18 DIAGNOSIS — Z79890 Hormone replacement therapy (postmenopausal): Secondary | ICD-10-CM | POA: Diagnosis not present

## 2024-07-18 DIAGNOSIS — N951 Menopausal and female climacteric states: Secondary | ICD-10-CM

## 2024-07-18 DIAGNOSIS — Z3046 Encounter for surveillance of implantable subdermal contraceptive: Secondary | ICD-10-CM

## 2024-07-18 DIAGNOSIS — B356 Tinea cruris: Secondary | ICD-10-CM | POA: Diagnosis not present

## 2024-07-18 MED ORDER — PROGESTERONE MICRONIZED 100 MG PO CAPS: 100.0000 mg | ORAL_CAPSULE | Freq: Every day | ORAL | 2 refills | Status: AC

## 2024-07-18 NOTE — Patient Instructions (Signed)
I value your feedback and you entrusting [place] with your care. If you get a [facility] patient survey, I would appreciate you taking the time to let us know about your experience today. Thank you!  Remove the dressing in 24 hours,  keep the incision area dry for 24 hours and remove the Steristrip in 2-3  days.  Notify us if any signs of tenderness, redness, pain, or fevers develop.

## 2024-07-18 NOTE — Progress Notes (Signed)
 [employer] Complaint  Patient presents with   Nexplanon  Removal   Follow-up    Vaginitis, sx have cleared, no concerns.    HPI:      Mackenzie Randall is a 53 y.o. G0P0000 whose LMP was No LMP recorded. Patient has had an implant., presents today for vaginitis f/u from  7/25 annual and nexplanon  removal. Pt was having vaginal itching for awhile; uncontrolled DM. Diagnosed with significant tinea cruris and treated with diflucan  x 1 and ketoconazole  daily for 2 wks. Pt states sx have resolved and Mackenzie Randall is better.  Nexplanon  placed 6/21; no BTB/dysmen. On climara  0.025 mg patch and now prometrium  100 mg. No VS sx, doing well.   Patient Active Problem List   Diagnosis Date Noted   UTI (urinary tract infection) 06/22/2024   HLD (hyperlipidemia) 06/22/2024   CKD (chronic kidney disease) stage 4, GFR 15-29 ml/min (HCC) 06/22/2024   Type II diabetes mellitus with renal manifestations (HCC) 06/22/2024   GERD (gastroesophageal reflux disease) 06/22/2024   Depression with anxiety 06/22/2024   Obesity, Class III, BMI 40-49.9 (morbid obesity) 06/22/2024   Tinea cruris 06/22/2024   Perimenopausal vasomotor symptoms 06/22/2024   GI bleeding 06/22/2024   Acute blood loss anemia 06/22/2024   Bilateral leg edema 06/22/2024   Fall at home, initial encounter 06/22/2024   OSA (obstructive sleep apnea) 04/01/2024   Nexplanon  in place 02/01/2023   Obesity, unspecified 03/17/2016   History of psychological trauma 07/23/2014   Sleep apnea 07/23/2014   Mild cognitive impairment 07/23/2014   Asthma 07/23/2014   Hypertension 07/23/2014   Diabetes mellitus, type II (HCC) 07/23/2014   Schizophrenia (HCC) 07/23/2014   History of abnormal cervical Pap smear 02/28/2006    Past Surgical History:  Procedure Laterality Date   Denies surgical history     ESOPHAGOGASTRODUODENOSCOPY N/A 06/23/2024   Procedure: EGD (ESOPHAGOGASTRODUODENOSCOPY);  Surgeon: Onita Elspeth Sharper,  DO;  Location: [facility] ENDOSCOPY;  Service: Gastroenterology;  Laterality: N/A;    Family History  Problem Relation Age of Onset   Diabetes Mother    Hypertension Mother    Sleep apnea Father    Heart attack Father    Gout Maternal Grandfather    Breast cancer Neg Hx     Social History   Socioeconomic History   Marital status: Single    Spouse name: Not on file   Number of children: 0   Years of education: Not on file   Highest education level: High school graduate  Occupational History   Not on file  Tobacco Use   Smoking status: Never   Smokeless tobacco: Current    Types: Chew, Snuff   Tobacco comments:    Reports remote history of cigarette use. Unsure what year quit as has been so long ago.  Vaping Use   Vaping status: Never Used  Substance and Sexual Activity   Alcohol use: Not Currently   Drug use: Never   Sexual activity: Not Currently    Partners: Male    Birth control/protection: Implant    Comment: No sex in over a year  Other Topics Concern   Not on file  Social History Narrative   Not on file   Social Drivers of Health   Financial Resource Strain: Not on file  Food Insecurity: No Food Insecurity (06/23/2024)   Hunger Vital Sign    Worried About Running Out of Food in the Last Year: Never true    Ran Out  of Food in the Last Year: Never true  Transportation Needs: No Transportation Needs (06/23/2024)   PRAPARE - Administrator, Civil Service (Medical): No    Lack of Transportation (Non-Medical): No  Physical Activity: Not on file  Stress: Not on file  Social Connections: Not on file  Intimate Partner Violence: Not At Risk (06/23/2024)   Humiliation, Afraid, Rape, and Kick questionnaire    Fear of Current or Ex-Partner: No    Emotionally Abused: No    Physically Abused: No    Sexually Abused: No    Outpatient Medications Prior to Visit  Medication Sig Dispense Refill   ACCU-CHEK GUIDE TEST test strip      Accu-Chek Softclix Lancets  lancets SMARTSIG:Topical     albuterol  (VENTOLIN  HFA) 108 (90 Base) MCG/ACT inhaler Inhale 2 puffs into the lungs every 6 (six) hours as needed.     amLODipine (NORVASC) 10 MG tablet Take 10 mg by mouth.     aspirin EC 81 MG tablet Take 81 mg by mouth.     buPROPion  (WELLBUTRIN  XL) 150 MG 24 hr tablet Take 150 mg by mouth daily.     carvedilol (COREG) 25 MG tablet Take 25 mg by mouth daily.     cetirizine (ZYRTEC) 10 MG tablet Take 10 mg by mouth daily.     clobetasol  ointment (TEMOVATE ) 0.05 % Apply once a day for 4 weeks, then every other day for 4 weeks, then once a week as needed. 30 g 5   cloNIDine (CATAPRES) 0.1 MG tablet Take 0.1 mg by mouth daily.     clotrimazole  (LOTRIMIN ) 1 % cream Apply topically 2 (two) times daily.     colchicine  0.6 MG tablet Take 0.6 mg by mouth.     desvenlafaxine (PRISTIQ) 100 MG 24 hr tablet Take 100 mg by mouth daily.     divalproex  (DEPAKOTE  ER) 500 MG 24 hr tablet Take 500 mg by mouth 3 (three) times daily.     estradiol  (CLIMARA  - DOSED IN MG/24 HR) 0.025 mg/24hr patch Place 1 patch (0.025 mg total) onto the skin once a week. 12 patch 3   FARXIGA 10 MG TABS tablet Take 10 mg by mouth daily.     ferrous sulfate  324 (65 Fe) MG TBEC Take 1 tablet (324 mg total) by mouth daily.     fluticasone (FLONASE) 50 MCG/ACT nasal spray Place into both nostrils.     fluticasone-salmeterol (ADVAIR) 250-50 MCG/ACT AEPB Inhale 1 puff into the lungs.     furosemide (LASIX) 20 MG tablet Take 20 mg by mouth daily.     gabapentin (NEURONTIN) 100 MG capsule Take by mouth.     hydrochlorothiazide (HYDRODIURIL) 25 MG tablet Take 25 mg by mouth daily.     JARDIANCE 25 MG TABS tablet Take 25 mg by mouth daily.     KERENDIA  20 MG TABS Take by mouth.     ketoconazole  (NIZORAL ) 2 % cream Apply 1 Application topically daily. For 4 wks 60 g 2   KLOR-CON M20 20 MEQ tablet Take by mouth.     LANTUS  SOLOSTAR 100 UNIT/ML Solostar Pen Inject into the skin.     levothyroxine  (SYNTHROID )  100 MCG tablet Take 100 mcg by mouth daily.     lisinopril (ZESTRIL) 40 MG tablet Take 40 mg by mouth daily.     methylphenidate  (RITALIN ) 10 MG tablet Take 10 mg by mouth daily.     metolazone (ZAROXOLYN) 2.5 MG tablet Take 2.5 mg by  mouth daily.     nitrofurantoin, macrocrystal-monohydrate, (MACROBID) 100 MG capsule Take 100 mg by mouth 2 (two) times daily.     omeprazole (PRILOSEC) 20 MG capsule Take 20 mg by mouth daily.     pantoprazole  (PROTONIX ) 40 MG tablet Take 1 tablet (40 mg total) by mouth daily. 30 tablet 11   pravastatin (PRAVACHOL) 40 MG tablet Take 40 mg by mouth daily.     QUEtiapine  (SEROQUEL ) 50 MG tablet Take by mouth.     rosuvastatin  (CRESTOR ) 40 MG tablet Take 40 mg by mouth daily.     sitaGLIPtin (JANUVIA) 100 MG tablet Take 100 mg by mouth daily.     STIOLTO RESPIMAT 2.5-2.5 MCG/ACT AERS SMARTSIG:2 Puff(s) Via Inhaler Daily     SURE COMFORT PEN NEEDLES 31G X 5 MM MISC      traZODone (DESYREL) 100 MG tablet Take 100 mg by mouth at bedtime.     TRULICITY 1.5 MG/0.5ML SOAJ Inject into the skin.     ziprasidone  (GEODON ) 80 MG capsule Take 80 mg by mouth 2 (two) times daily.     etonogestrel  (NEXPLANON ) 68 MG IMPL implant 1 each by Subdermal route once.     progesterone  (PROMETRIUM ) 100 MG capsule Take 1 capsule (100 mg total) by mouth daily. 90 capsule 0   No facility-administered medications prior to visit.      ROS:  Review of Systems  Constitutional:  Negative for fever.  Gastrointestinal:  Negative for blood in stool, constipation, diarrhea, nausea and vomiting.  Genitourinary:  Negative for dyspareunia, dysuria, flank pain, frequency, hematuria, urgency, vaginal bleeding, vaginal discharge and vaginal pain.  Musculoskeletal:  Negative for back pain.  Skin:  Negative for rash.   BREAST: No symptoms   OBJECTIVE:   Vitals:  BP (!) 83/53   Pulse 79   Wt 243 lb (110.2 kg)   BMI 40.44 kg/m   Physical Exam Vitals reviewed.  Constitutional:       Appearance: Mackenzie Randall is well-developed.  Pulmonary:     Effort: Pulmonary effort is normal.  Genitourinary:    Labia:        Right: No rash, tenderness or lesion.        Left: No rash, tenderness or lesion.      Comments: NEG VAG EXAM; ERYTHEMA/TINEA RESOLVED Musculoskeletal:        General: Normal range of motion.     Cervical back: Normal range of motion.  Skin:    General: Skin is warm and dry.  Neurological:     General: No focal deficit present.     Mental Status: Mackenzie Randall is alert and oriented to person, place, and time.     Cranial Nerves: No cranial nerve deficit.  Psychiatric:        Mood and Affect: Mood normal.        Behavior: Behavior normal.        Thought Content: Thought content normal.        Judgment: Judgment normal.     Nexplanon  removal Procedure note - The Nexplanon  was noted in the patient's arm and the end was identified. The skin was cleansed with a Betadine solution. A small injection of subcutaneous lidocaine  with epinephrine was given over the end of the implant. An incision was made at the end of the implant. The rod was noted in the incision and grasped with a hemostat. It was noted to be intact.  Steri-Strip was placed approximating the incision. Hemostasis was noted.   Assessment/Plan: Tinea  cruris--sx resolved. F/u prn. Has leftover ketoconazole  crm prn future sx. Keep dry.   Perimenopausal vasomotor symptoms - Plan: progesterone  (PROMETRIUM ) 100 MG capsule; doing well on HRT. Rx RF prog till annual next yr. F/u prn BTB.   Hormone replacement therapy (HRT) - Plan: progesterone  (PROMETRIUM ) 100 MG capsule  Nexplanon  removal--Mackenzie Randall was told to remove the dressing in 12-24 hours, to keep the incision area dry for 24 hours and to remove the Steristrip in 2-3  days.  Notify us  if any signs of tenderness, redness, pain, or fevers develop.    Return in about 1 year (around 07/18/2025) for annual.  Teal Raben B. Juliany Daughety, PA-C 07/18/2024 12:29 PM

## 2024-09-16 ENCOUNTER — Ambulatory Visit
Admission: RE | Admit: 2024-09-16 | Discharge: 2024-09-16 | Disposition: A | Discharge status: Home / Self Care | Payer: MEDICAID | Source: Ambulatory Visit | Attending: Obstetrics and Gynecology | Admitting: Obstetrics and Gynecology

## 2024-09-16 DIAGNOSIS — Z1231 Encounter for screening mammogram for malignant neoplasm of breast: Secondary | ICD-10-CM | POA: Insufficient documentation

## 2024-09-19 ENCOUNTER — Ambulatory Visit: Payer: Self-pay | Admitting: Obstetrics and Gynecology

## 2024-11-10 NOTE — Progress Notes (Deleted)
 [facility] [address] [place], [place] [address]  Pulmonary Sleep Medicine   Office Visit Note  Patient Name: Mackenzie Randall DOB: 1971-11-25 MRN [id number]    Chief Complaint: Obstructive Sleep Apnea visit  Brief History:  Mackenzie Randall is seen today for a follow up visit for APAP@ 8-15 cmH2O. The patient has a longstanding history of sleep apnea. Patient is using PAP nightly.  The patient feels rested after sleeping with PAP.  The patient reports benefiting from PAP use. Reported sleepiness is  improved and the Epworth Sleepiness Score is *** out of 24. The patient *** take naps. The patient complains of the following: ***  The compliance download shows 70% compliance with an average use time of 8 hour 5 minutes. The AHI is 12.1.  The patient *** of limb movements disrupting sleep. The patient continues to require PAP therapy in order to eliminate sleep apnea.   ROS  General: (-) fever, (-) chills, (-) night sweat Nose and Sinuses: (-) nasal stuffiness or itchiness, (-) postnasal drip, (-) nosebleeds, (-) sinus trouble. Mouth and Throat: (-) sore throat, (-) hoarseness. Neck: (-) swollen glands, (-) enlarged thyroid , (-) neck pain. Respiratory: *** cough, *** shortness of breath, *** wheezing. Neurologic: *** numbness, *** tingling. Psychiatric: *** anxiety, *** depression   Current Medication: Outpatient Encounter Medications as of 11/11/2024  Medication Sig   ACCU-CHEK GUIDE TEST test strip    Accu-Chek Softclix Lancets lancets SMARTSIG:Topical   albuterol  (VENTOLIN  HFA) 108 (90 Base) MCG/ACT inhaler Inhale 2 puffs into the lungs every 6 (six) hours as needed.   amLODipine (NORVASC) 10 MG tablet Take 10 mg by mouth.   aspirin EC 81 MG tablet Take 81 mg by mouth.   buPROPion  (WELLBUTRIN  XL) 150 MG 24 hr tablet Take 150 mg by mouth daily.   carvedilol (COREG) 25 MG tablet Take 25 mg by mouth daily.   cetirizine (ZYRTEC) 10 MG tablet Take 10 mg by mouth daily.    clobetasol  ointment (TEMOVATE ) 0.05 % Apply once a day for 4 weeks, then every other day for 4 weeks, then once a week as needed.   cloNIDine (CATAPRES) 0.1 MG tablet Take 0.1 mg by mouth daily.   clotrimazole  (LOTRIMIN ) 1 % cream Apply topically 2 (two) times daily.   colchicine  0.6 MG tablet Take 0.6 mg by mouth.   desvenlafaxine (PRISTIQ) 100 MG 24 hr tablet Take 100 mg by mouth daily.   divalproex  (DEPAKOTE  ER) 500 MG 24 hr tablet Take 500 mg by mouth 3 (three) times daily.   estradiol  (CLIMARA  - DOSED IN MG/24 HR) 0.025 mg/24hr patch Place 1 patch (0.025 mg total) onto the skin once a week.   FARXIGA 10 MG TABS tablet Take 10 mg by mouth daily.   ferrous sulfate  324 (65 Fe) MG TBEC Take 1 tablet (324 mg total) by mouth daily.   fluticasone (FLONASE) 50 MCG/ACT nasal spray Place into both nostrils.   fluticasone-salmeterol (ADVAIR) 250-50 MCG/ACT AEPB Inhale 1 puff into the lungs.   furosemide (LASIX) 20 MG tablet Take 20 mg by mouth daily.   gabapentin (NEURONTIN) 100 MG capsule Take by mouth.   hydrochlorothiazide (HYDRODIURIL) 25 MG tablet Take 25 mg by mouth daily.   JARDIANCE 25 MG TABS tablet Take 25 mg by mouth daily.   KERENDIA  20 MG TABS Take by mouth.   ketoconazole  (NIZORAL ) 2 % cream Apply 1 Application topically daily. For 4 wks   KLOR-CON M20 20 MEQ tablet Take by mouth.  LANTUS  SOLOSTAR 100 UNIT/ML Solostar Pen Inject into the skin.   levothyroxine  (SYNTHROID ) 100 MCG tablet Take 100 mcg by mouth daily.   lisinopril (ZESTRIL) 40 MG tablet Take 40 mg by mouth daily.   methylphenidate  (RITALIN ) 10 MG tablet Take 10 mg by mouth daily.   metolazone (ZAROXOLYN) 2.5 MG tablet Take 2.5 mg by mouth daily.   nitrofurantoin, macrocrystal-monohydrate, (MACROBID) 100 MG capsule Take 100 mg by mouth 2 (two) times daily.   omeprazole (PRILOSEC) 20 MG capsule Take 20 mg by mouth daily.   pantoprazole  (PROTONIX ) 40 MG tablet Take 1 tablet (40 mg total) by mouth daily.   pravastatin  (PRAVACHOL) 40 MG tablet Take 40 mg by mouth daily.   progesterone  (PROMETRIUM ) 100 MG capsule Take 1 capsule (100 mg total) by mouth daily.   QUEtiapine  (SEROQUEL ) 50 MG tablet Take by mouth.   rosuvastatin  (CRESTOR ) 40 MG tablet Take 40 mg by mouth daily.   sitaGLIPtin (JANUVIA) 100 MG tablet Take 100 mg by mouth daily.   STIOLTO RESPIMAT 2.5-2.5 MCG/ACT AERS SMARTSIG:2 Puff(s) Via Inhaler Daily   SURE COMFORT PEN NEEDLES 31G X 5 MM MISC    traZODone (DESYREL) 100 MG tablet Take 100 mg by mouth at bedtime.   TRULICITY 1.5 MG/0.5ML SOAJ Inject into the skin.   ziprasidone  (GEODON ) 80 MG capsule Take 80 mg by mouth 2 (two) times daily.   No facility-administered encounter medications on file as of 11/11/2024.    Surgical History: Past Surgical History:  Procedure Laterality Date   Denies surgical history     ESOPHAGOGASTRODUODENOSCOPY N/A 06/23/2024   Procedure: EGD (ESOPHAGOGASTRODUODENOSCOPY);  Surgeon: Mackenzie Elspeth Sharper, DO;  Location: [facility] ENDOSCOPY;  Service: Gastroenterology;  Laterality: N/A;    Medical History: Past Medical History:  Diagnosis Date   Asthma    Diabetes mellitus without complication (HCC)    Hyperlipemia    Hypertension    Hypothyroidism    Schizophrenia (HCC)    Sleep apnea     Family History: Non contributory to the present illness  Social History: Social History   Socioeconomic History   Marital status: Single    Spouse name: Not on file   Number of children: 0   Years of education: Not on file   Highest education level: High school graduate  Occupational History   Not on file  Tobacco Use   Smoking status: Never   Smokeless tobacco: Current    Types: Chew, Snuff   Tobacco comments:    Reports remote history of cigarette use. Unsure what year quit as has been so long ago.  Vaping Use   Vaping status: Never Used  Substance and Sexual Activity   Alcohol use: Not Currently   Drug use: Never   Sexual activity: Not Currently     Partners: Male    Birth control/protection: Implant    Comment: No sex in over a year  Other Topics Concern   Not on file  Social History Narrative   Not on file   Social Drivers of Health   Tobacco Use: Medium Risk (09/12/2024)   Received from [facility]   Patient History    Smoking Tobacco Use: Former    Smokeless Tobacco Use: Former    Passive Exposure: Not on Actuary Strain: Not on file  Food Insecurity: No Food Insecurity (06/23/2024)   Epic    Worried About Radiation Protection Practitioner of Food in the Last Year: Never true    Ran Out of  Food in the Last Year: Never true  Transportation Needs: No Transportation Needs (06/23/2024)   Epic    Lack of Transportation (Medical): No    Lack of Transportation (Non-Medical): No  Physical Activity: Not on file  Stress: Not on file  Social Connections: Not on file  Intimate Partner Violence: Not At Risk (06/23/2024)   Epic    Fear of Current or Ex-Partner: No    Emotionally Abused: No    Physically Abused: No    Sexually Abused: No  Depression (PHQ2-9): Not on file  Alcohol Screen: Not on file  Housing: Low Risk (06/23/2024)   Epic    Unable to Pay for Housing in the Last Year: No    Number of Times Moved in the Last Year: 0    Homeless in the Last Year: No  Utilities: Not At Risk (06/23/2024)   Epic    Threatened with loss of utilities: No  Health Literacy: Not on file    Vital Signs: Last menstrual period 03/20/2017. There is no height or weight on file to calculate BMI.    Examination: General Appearance: The patient is well-developed, well-nourished, and in no distress. Neck Circumference: 40 cm Skin: Gross inspection of skin unremarkable. Head: normocephalic, no gross deformities. Eyes: no gross deformities noted. ENT: ears appear grossly normal Neurologic: Alert and oriented. No involuntary movements.  STOP BANG RISK ASSESSMENT S (snore) Have you been told that you snore?     YES/NO   T  (tired) Are you often tired, fatigued, or sleepy during the day?   YES/NO  O (obstruction) Do you stop breathing, choke, or gasp during sleep? YES/NO   P (pressure) Do you have or are you being treated for high blood pressure? YES   B (BMI) Is your body index greater than 35 kg/m? YES   A (age) Are you 36 years old or older? YES   N (neck) Do you have a neck circumference greater than 16 inches?   NO   G (gender) Are you a female? NO   TOTAL STOP/BANG YES ANSWERS        A STOP-Bang score of 2 or less is considered low risk, and a score of 5 or more is high risk for having either moderate or severe OSA. For people who score 3 or 4, doctors may need to perform further assessment to determine how likely they are to have OSA.         EPWORTH SLEEPINESS SCALE:  Scale:  (0)= no chance of dozing; (1)= slight chance of dozing; (2)= moderate chance of dozing; (3)= high chance of dozing  Chance  Situtation    Sitting and reading: ***    Watching TV: ***    Sitting Inactive in public: ***    As a passenger in car: ***      Lying down to rest: ***    Sitting and talking: ***    Sitting quielty after lunch: ***    In a car, stopped in traffic: ***   TOTAL SCORE:   *** out of 24    SLEEP STUDIES:  PSG (04/2024) AHI 34.3/hr, Supine AHI 67.1/hr (46.7% of these are central), RDI w/ RERAs 135.8/hr, min SpO2 78% Titration (04/2024) APAP@ 8-15 cmH2O   CPAP COMPLIANCE DATA:  Date Range: 08/17/2024-09/15/2024  Average Daily Use: 8 hours 5 minutes  Median Use: 8 hours 40 minutes  Compliance for > 4 Hours: 70%  AHI: 12.1 respiratory events per hour  Days Used: 23/30  days  Mask Leak: 39.8  95th Percentile Pressure: 12.6         LABS: No results found for this or any previous visit (from the past 2160 hours).  Radiology: MM 3D SCREENING MAMMOGRAM BILATERAL BREAST Result Date: 09/19/2024 CLINICAL DATA:  Screening. EXAM: DIGITAL SCREENING BILATERAL MAMMOGRAM  WITH TOMOSYNTHESIS AND CAD TECHNIQUE: Bilateral screening digital craniocaudal and mediolateral oblique mammograms were obtained. Bilateral screening digital breast tomosynthesis was performed. The images were evaluated with computer-aided detection. COMPARISON:  Previous exam(s). ACR Breast Density Category a: The breasts are almost entirely fatty. FINDINGS: There are no findings suspicious for malignancy. IMPRESSION: No mammographic evidence of malignancy. A result letter of this screening mammogram will be mailed directly to the patient. RECOMMENDATION: Screening mammogram in one year. (Code:SM-B-01Y) BI-RADS CATEGORY  1: Negative. Electronically Signed   By: Norleen Croak M.D.   On: 09/19/2024 07:54    No results found.  No results found.    Assessment and Plan: Patient Active Problem List   Diagnosis Date Noted   UTI (urinary tract infection) 06/22/2024   HLD (hyperlipidemia) 06/22/2024   CKD (chronic kidney disease) stage 4, GFR 15-29 ml/min (HCC) 06/22/2024   Type II diabetes mellitus with renal manifestations (HCC) 06/22/2024   GERD (gastroesophageal reflux disease) 06/22/2024   Depression with anxiety 06/22/2024   Obesity, Class III, BMI 40-49.9 (morbid obesity) (HCC) 06/22/2024   Tinea cruris 06/22/2024   Perimenopausal vasomotor symptoms 06/22/2024   GI bleeding 06/22/2024   Acute blood loss anemia 06/22/2024   Bilateral leg edema 06/22/2024   Fall at home, initial encounter 06/22/2024   OSA (obstructive sleep apnea) 04/01/2024   Nexplanon  in place 02/01/2023   Obesity, unspecified 03/17/2016   History of psychological trauma 07/23/2014   Sleep apnea 07/23/2014   Mild cognitive impairment 07/23/2014   Asthma 07/23/2014   Hypertension 07/23/2014   Diabetes mellitus, type II (HCC) 07/23/2014   Schizophrenia (HCC) 07/23/2014   History of abnormal cervical Pap smear 02/28/2006      The patient *** tolerate PAP and reports *** benefit from PAP use. The patient was  reminded how to *** and advised to ***. The patient was also counselled on ***. The compliance is ***. The AHI is ***.   ***  General Counseling: I have discussed the findings of the evaluation and examination with Reena.  I have also discussed any further diagnostic evaluation thatmay be needed or ordered today. Rasheema verbalizes understanding of the findings of todays visit. We also reviewed her medications today and discussed drug interactions and side effects including but not limited excessive drowsiness and altered mental states. We also discussed that there is always a risk not just to her but also people around her. she has been encouraged to call the office with any questions or concerns that should arise related to todays visit.  No orders of the defined types were placed in this encounter.       I have personally obtained a history, examined the patient, evaluated laboratory and imaging results, formulated the assessment and plan and placed orders.  Elfreda DELENA Bathe, MD [facility] Diplomate ABMS Pulmonary Critical Care Medicine and Sleep Medicine

## 2024-11-11 ENCOUNTER — Ambulatory Visit: Payer: MEDICAID
